# Patient Record
Sex: Female | Born: 1935 | Race: White | Hispanic: No | Marital: Married | Smoking: Never smoker
Health system: Southern US, Community
[De-identification: ages and names within clinical notes are randomized; demographics above are authoritative.]

## PROBLEM LIST (undated history)

## (undated) DIAGNOSIS — T7840XA Allergy, unspecified, initial encounter: Secondary | ICD-10-CM

## (undated) DIAGNOSIS — I10 Essential (primary) hypertension: Secondary | ICD-10-CM

## (undated) DIAGNOSIS — M1990 Unspecified osteoarthritis, unspecified site: Secondary | ICD-10-CM

## (undated) HISTORY — DX: Essential (primary) hypertension: I10

## (undated) HISTORY — DX: Allergy, unspecified, initial encounter: T78.40XA

## (undated) HISTORY — DX: Unspecified osteoarthritis, unspecified site: M19.90

---

## 1990-09-09 ENCOUNTER — Encounter (INDEPENDENT_AMBULATORY_CARE_PROVIDER_SITE_OTHER): Payer: Self-pay | Admitting: *Deleted

## 1999-06-26 ENCOUNTER — Encounter: Admission: RE | Admit: 1999-06-26 | Discharge: 1999-06-26 | Payer: Self-pay | Admitting: Orthopedic Surgery

## 1999-06-26 ENCOUNTER — Encounter: Payer: Self-pay | Admitting: Orthopedic Surgery

## 2000-05-27 ENCOUNTER — Other Ambulatory Visit: Admission: RE | Admit: 2000-05-27 | Discharge: 2000-05-27 | Payer: Self-pay | Admitting: Gastroenterology

## 2000-05-27 ENCOUNTER — Encounter (INDEPENDENT_AMBULATORY_CARE_PROVIDER_SITE_OTHER): Payer: Self-pay | Admitting: Specialist

## 2000-05-27 ENCOUNTER — Encounter: Payer: Self-pay | Admitting: Gastroenterology

## 2003-12-05 ENCOUNTER — Encounter: Admission: RE | Admit: 2003-12-05 | Discharge: 2003-12-22 | Payer: Self-pay | Admitting: Internal Medicine

## 2004-02-06 ENCOUNTER — Ambulatory Visit: Payer: Self-pay

## 2004-07-04 ENCOUNTER — Encounter: Admission: RE | Admit: 2004-07-04 | Discharge: 2004-07-17 | Payer: Self-pay | Admitting: Internal Medicine

## 2005-08-26 ENCOUNTER — Encounter: Admission: RE | Admit: 2005-08-26 | Discharge: 2005-09-11 | Payer: Self-pay | Admitting: Internal Medicine

## 2006-03-04 ENCOUNTER — Ambulatory Visit: Payer: Self-pay | Admitting: Gastroenterology

## 2008-03-20 ENCOUNTER — Ambulatory Visit: Payer: Self-pay | Admitting: Gastroenterology

## 2008-04-20 ENCOUNTER — Encounter: Payer: Self-pay | Admitting: Gastroenterology

## 2008-04-20 ENCOUNTER — Ambulatory Visit: Payer: Self-pay | Admitting: Gastroenterology

## 2008-04-21 ENCOUNTER — Encounter: Payer: Self-pay | Admitting: Gastroenterology

## 2009-05-31 ENCOUNTER — Telehealth: Payer: Self-pay | Admitting: Gastroenterology

## 2009-06-06 ENCOUNTER — Ambulatory Visit (HOSPITAL_COMMUNITY): Admission: RE | Admit: 2009-06-06 | Discharge: 2009-06-06 | Payer: Self-pay | Admitting: Internal Medicine

## 2009-06-06 ENCOUNTER — Encounter: Payer: Self-pay | Admitting: Physician Assistant

## 2009-07-31 ENCOUNTER — Telehealth: Payer: Self-pay | Admitting: Gastroenterology

## 2009-08-07 ENCOUNTER — Ambulatory Visit: Payer: Self-pay | Admitting: Internal Medicine

## 2009-08-07 DIAGNOSIS — I10 Essential (primary) hypertension: Secondary | ICD-10-CM | POA: Insufficient documentation

## 2009-08-07 DIAGNOSIS — R143 Flatulence: Secondary | ICD-10-CM

## 2009-08-07 DIAGNOSIS — E785 Hyperlipidemia, unspecified: Secondary | ICD-10-CM

## 2009-08-07 DIAGNOSIS — R142 Eructation: Secondary | ICD-10-CM

## 2009-08-07 DIAGNOSIS — R1032 Left lower quadrant pain: Secondary | ICD-10-CM | POA: Insufficient documentation

## 2009-08-07 DIAGNOSIS — Z860100 Personal history of colon polyps, unspecified: Secondary | ICD-10-CM | POA: Insufficient documentation

## 2009-08-07 DIAGNOSIS — R141 Gas pain: Secondary | ICD-10-CM

## 2009-08-07 DIAGNOSIS — R1031 Right lower quadrant pain: Secondary | ICD-10-CM

## 2009-08-07 DIAGNOSIS — R12 Heartburn: Secondary | ICD-10-CM

## 2009-08-10 ENCOUNTER — Ambulatory Visit: Payer: Self-pay | Admitting: Cardiovascular Disease

## 2009-08-10 LAB — CONVERTED CEMR LAB
BUN: 17 mg/dL (ref 6–23)
Basophils Absolute: 0 10*3/uL (ref 0.0–0.1)
Basophils Relative: 0.6 % (ref 0.0–3.0)
CO2: 35 meq/L — ABNORMAL HIGH (ref 19–32)
CRP, High Sensitivity: 1.45 (ref 0.00–5.00)
Calcium: 9.5 mg/dL (ref 8.4–10.5)
Chloride: 100 meq/L (ref 96–112)
Creatinine, Ser: 0.8 mg/dL (ref 0.4–1.2)
Eosinophils Absolute: 0.2 10*3/uL (ref 0.0–0.7)
Eosinophils Relative: 2.7 % (ref 0.0–5.0)
GFR calc non Af Amer: 78.78 mL/min (ref >60)
Glucose, Bld: 90 mg/dL (ref 70–99)
HCT: 37.1 % (ref 36.0–46.0)
Hemoglobin: 12.4 g/dL (ref 12.0–15.0)
Lymphocytes Relative: 24 % (ref 12.0–46.0)
Lymphs Abs: 1.4 10*3/uL (ref 0.7–4.0)
MCHC: 33.5 g/dL (ref 30.0–36.0)
MCV: 94.4 fL (ref 78.0–100.0)
Monocytes Absolute: 0.4 10*3/uL (ref 0.1–1.0)
Monocytes Relative: 7 % (ref 3.0–12.0)
Neutro Abs: 3.8 10*3/uL (ref 1.4–7.7)
Neutrophils Relative %: 65.7 % (ref 43.0–77.0)
Platelets: 230 10*3/uL (ref 150.0–400.0)
Potassium: 4.1 meq/L (ref 3.5–5.1)
RBC: 3.93 M/uL (ref 3.87–5.11)
RDW: 13.1 % (ref 11.5–14.6)
Sodium: 142 meq/L (ref 135–145)
WBC: 5.8 10*3/uL (ref 4.5–10.5)

## 2009-08-13 ENCOUNTER — Ambulatory Visit: Payer: Self-pay | Admitting: Gastroenterology

## 2009-08-13 DIAGNOSIS — R933 Abnormal findings on diagnostic imaging of other parts of digestive tract: Secondary | ICD-10-CM

## 2010-04-09 NOTE — Assessment & Plan Note (Signed)
Summary: F/U Abd pain, review CT results, saw PA   History of Present Illness Visit Type: Follow-up Visit Primary GI MD: Elie Goody MD [facility] Primary Provider: Geoffry Paradise, MD Chief Complaint: follow-up abdominal pain, review CT results. abdominal pain is better History of Present Illness:   This is a return office visit for lower abdominal pain and intestinal gas. Her symptoms improved substantially with Gas-X and Align although they persist. She notes bloating, lower abdominal pain, and improvement in symptoms with passage of flatus.  CT Scan of the abdomen and pelvis revealed questionable gallbladder calcifications. However, a recent abdominal ultrasound did not note this abnormality. An 8 mm hepatic lesion was noted onh CT, which had features most typical of a hemangioma.   GI Review of Systems    Reports bloating.      Denies abdominal pain, acid reflux, belching, chest pain, dysphagia with liquids, dysphagia with solids, heartburn, loss of appetite, nausea, vomiting, vomiting blood, weight loss, and  weight gain.        Denies anal fissure, black tarry stools, change in bowel habit, constipation, diarrhea, diverticulosis, fecal incontinence, heme positive stool, hemorrhoids, irritable bowel syndrome, jaundice, light color stool, liver problems, rectal bleeding, and  rectal pain.   Current Medications (verified): 1)  Benicar Hct 40-12.5 Mg Tabs (Olmesartan Medoxomil-Hctz) .... Once Daily 2)  Nexium 40 Mg Cpdr (Esomeprazole Magnesium) .... Once Daily As Needed 3)  Align  Caps (Probiotic Product) .... Take 1 Tab Daily For 1 Month 4)  Celebrex 200 Mg Caps (Celecoxib) .Marland Kitchen.. 1 By Mouth Once Daily 5)  Gas-X Extra Strength 125 Mg Caps (Simethicone) .Marland Kitchen.. 1 By Mouth Two Times A Day  Allergies (verified): 1)  ! Asa 2)  ! Penicillin 3)  ! Voltaren 4)  ! Prednisone 5)  ! Ultram 6)  ! * Tiazac  Past History:  Past Medical History: Reviewed history from 08/09/2009 and no changes  required. Arthritis Hyperlipidemia Hypertension COLON POLYPS/HYPERPLASTIC 2/10 Osteoporosis Hemorrhoids GERD  Past Surgical History: D&C  Family History: Reviewed history from 08/07/2009 and no changes required. No FH of Colon Cancer: Family History of Heart Disease: Both parents  Social History: Reviewed history from 08/07/2009 and no changes required. Occupation: Retired Patient has never smoked.  Alcohol Use - no Illicit Drug Use - no Married  Review of Systems       The pertinent positives and negatives are noted as above and in the HPI. All other ROS were reviewed and were negative.   Vital Signs:  Patient profile:   77 year old female Height:      64 inches Weight:      137 pounds BMI:     23.60 Pulse rate:   68 / minute Pulse rhythm:   irregular BP sitting:   102 / 64  (left arm)  Vitals Entered By: Milford Cage NCMA (August 13, 2009 10:06 AM)  Physical Exam  General:  Well developed, well nourished, no acute distress. Head:  Normocephalic and atraumatic. Eyes:  PERRLA, no icterus. Mouth:  No deformity or lesions, dentition normal. Lungs:  Clear throughout to auscultation. Heart:  Regular rate and rhythm; no murmurs, rubs,  or bruits. Abdomen:  Soft, nontender and nondistended. No masses, hepatosplenomegaly or hernias noted. Normal bowel sounds. Psych:  Alert and cooperative. Normal mood and affect.  Impression & Recommendations:  Problem # 1:  ABDOMINAL BLOATING (ICD-787.3) Gas, bloating, and flatus. This appears to be the cause of her lower abdominal pain. Trial of Xifaxan,  for 10 days and Levsin sublingually as needed. She will remain on a low gas diet and increase the Gas-X to q.i.d. as needed.  Problem # 2:  ABNORMAL FINDINGS GI TRACT (ICD-793.4) Questionable gallbladder wall calcifications on CT scan and an 8 mm hepatic lesion consistent with a hemangioma. These abnormalities were not noted on abdominal ultrasound. No plan for further workup at  this time.  Patient Instructions: 1)  Pick up your prescriptions from your pharmacy.  2)  Increase Gas-X four times a day as needed.  3)  Excessive Gas Diet handout given.  4)  Please schedule a follow-up appointment as needed.  5)  Copy sent to : Geoffry Paradise, MD 6)  The medication list was reviewed and reconciled.  All changed / newly prescribed medications were explained.  A complete medication list was provided to the patient / caregiver.  Prescriptions: LEVSIN/SL 0.125 MG SUBL (HYOSCYAMINE SULFATE) 1-2 tablet by mouth under tongue four times a day as needed  #60 x 11   Entered by:   Christie Nottingham CMA (AAMA)   Authorized by:   Meryl Dare MD [facility]   Signed by:   Meryl Dare MD [facility] on 08/13/2009   Method used:   Electronically to        [facility]* (retail)       [address] [place], [place]  [address]       Ph: [phone]       Fax: [phone]   RxID:   [id number] XIFAXAN 550 MG TABS (RIFAXIMIN) one tablet by mouth three times a day  #30 x 0   Entered by:   Christie Nottingham CMA (AAMA)   Authorized by:   Meryl Dare MD [facility]   Signed by:   Meryl Dare MD [facility] on 08/13/2009   Method used:   Electronically to        [facility]* (retail)       [address] [place], [place]  [address]       Ph: [phone]       Fax: [phone]   RxID:   [id number]

## 2010-04-09 NOTE — Procedures (Signed)
Summary: EGD/[place]  EGD/[place]   Imported By: Sherian Rein 08/14/2009 09:22:34  _____________________________________________________________________  External Attachment:    Type:   Image     Comment:   External Document

## 2010-04-09 NOTE — Assessment & Plan Note (Signed)
Summary: abdominal pain/diarrhea/sheri   History of Present Illness Visit Type: Initial Visit Primary GI MD: Elie Goody MD [facility] Primary Provider: Gus Rankin, MD Chief Complaint: Abdominal pain(R-sided) History of Present Illness:   VEY NICE 77 YO FEMALE KNOWN TO DR. Russella Dar. SHE UNDERWENT COLONSCOCPY 2/10  FOR HEME + STOOL, AND WAS FOUND TO HAVE A CECAL POLYP AND INTERNAL HEMORRHOIDS. SHE COMES IN TODAY WITH C/O 3 MONTH HX OF LOWER ABDOMINAL PAIN. SHE DESCRIBES FREQUENT LOWER ABDOMINAL PAIN THAT FEELS LIKE PAINFUL TRAPPED GAS-THIS RADIATES ACROSS HER LOWER ABDOMEN AND INTO HER RIGHT SIDE. SHE SEEMS TO HAVE SOME HEARTBURN ASSOCIATED WITH IT AS WELL. HER APPETITE IS OK,BUT ALMOST EERYTHING HURTS HER AFTER SHE EATS.WEIGHT STABLE. BM'S LOOSER MOST DAYS WHEN HAVING PAIN. SHE FEELS BLOATED. NO URINARY SXS, NO MELENA OR HEME.  SHE HAD ABD [place] PER DR. Jacky Kindle WHICH WAS NEGATIVEHAS BEEN ON NEXIUM SHORT TERM AND FEELS IT HELPS THE HEARTBURN SOME,BUT DOESN'T WANT TO STAY ON IT DUE TO OSTEOPOROSIS.   GI Review of Systems    Reports abdominal pain, bloating, and  nausea.     Location of  Abdominal pain: lower abdomen.    Denies acid reflux, belching, chest pain, dysphagia with liquids, dysphagia with solids, heartburn, loss of appetite, vomiting, vomiting blood, weight loss, and  weight gain.      Reports diarrhea and  rectal pain.     Denies anal fissure, black tarry stools, change in bowel habit, constipation, diverticulosis, fecal incontinence, heme positive stool, hemorrhoids, irritable bowel syndrome, jaundice, light color stool, liver problems, and  rectal bleeding. Preventive Screening-Counseling & Management  Alcohol-Tobacco     Smoking Status: never      Drug Use:  no.      Current Medications (verified): 1)  Benicar Hct 40-12.5 Mg Tabs (Olmesartan Medoxomil-Hctz) .... Once Daily 2)  Nexium 40 Mg Cpdr (Esomeprazole Magnesium) .... Once Daily  Allergies (verified): 1)  !  Asa 2)  ! Penicillin 3)  ! Voltaren 4)  ! Prednisone 5)  ! Ultram 6)  ! * Tiazac  Past History:  Past Medical History: Arthritis Hyperlipidemia Hypertension COLON POLYPS/HYPERPLASTIC 2/10  Past Surgical History: Unremarkable  Family History: No FH of Colon Cancer: Family History of Heart Disease: Both parents  Social History: Occupation: Retired Patient has never smoked.  Alcohol Use - no Illicit Drug Use - no Smoking Status:  never Drug Use:  no  Review of Systems       The patient complains of allergy/sinus, arthritis/joint pain, back pain, headaches-new, heart murmur, and muscle pains/cramps.  The patient denies anemia, anxiety-new, blood in urine, breast changes/lumps, change in vision, confusion, cough, coughing up blood, depression-new, fainting, fatigue, fever, hearing problems, heart rhythm changes, itching, menstrual pain, night sweats, nosebleeds, pregnancy symptoms, shortness of breath, skin rash, sleeping problems, sore throat, swelling of feet/legs, swollen lymph glands, thirst - excessive , urination - excessive , urination changes/pain, urine leakage, vision changes, and voice change.         ROS OTHERWISE AS IN HPI  Vital Signs:  Patient profile:   77 year old female Height:      64 inches Weight:      136.25 pounds BMI:     23.47 Pulse rate:   80 / minute Pulse rhythm:   regular BP sitting:   92 / 60  (left arm) Cuff size:   regular  Vitals Entered By: June McMurray CMA Duncan Dull) (Aug 07, 2009 9:58 AM)  Physical Exam  General:  Well developed, well nourished, no acute distress. Head:  Normocephalic and atraumatic. Eyes:  PERRLA, no icterus. Chest Wall:  kyphosis.   Lungs:  Clear throughout to auscultation. Heart:  Regular rate and rhythm; no murmurs, rubs,  or bruits. Abdomen:  SOFT, NO FOCAL; TENDERNESS LOWER ABDOMEN, NO PALP MASS OR HSM,BS+ Rectal:  NOT DONE Extremities:  No clubbing, cyanosis, edema or deformities noted. Neurologic:  Alert  and  oriented x4;  grossly normal neurologically. Psych:  Alert and cooperative. Normal mood and affect.   Impression & Recommendations:  Problem # 1:  RLQ PAIN (ICD-789.03) Assessment New  77 YO FEMALE WITH 3 MONTH HX OF PERSISTENT LOWER ABDOMINAL PAIN,VARYING INTENSITY,ASSOCIATED WITH BLOATING. NO HX OF DIVERTICULAR DISEASE, NO PRIOR SURGERIES. ETIOLOGY IS NOT CLEAR. R/O PELVIC PATHOLOGY/MALIGNANCY,R/O MESENTERIC INSUFFICIENCY, R/O  FUNCTIONAL CONSTIPATION ETC.  TRIAL OF ALIGN ONE DAILY X 30 DAYS LABS AS BELOW SCHEDULE FOR CT ABD/PELVIS WITH CONTRAST ,ASK TO ASSESS MESENTERIC VESSELS AS WELL CONTINUE GAS X AS NEEDED Orders: TLB-CBC Platelet - w/Differential (85025-CBCD) TLB-BMP (Basic Metabolic Panel-BMET) (80048-METABOL) TLB-CRP-High Sensitivity (C-Reactive Protein) (86140-FCRP) CT Abdomen/Pelvis with Contrast (CT Abd/Pelvis w/con)  Problem # 2:  HEARTBURN (ICD-787.1) Assessment: New PT MAY CONTINUE NEXIUM 40 MG DAILY OR TRY OTC  ZANTAC  TWICE DAILY.  Problem # 3:  PERSONAL HX COLONIC POLYPS (ICD-V12.72) Assessment: Comment Only HYPERPLASTIC 2/10 -FOLLOW UP 04/2018  Problem # 4:  PERSONAL HX COLONIC POLYPS (ICD-V12.72) Assessment: Comment Only  Patient Instructions: 1)  Take the Align capsules 1 daily for 1 month.  2)  We have schedueld the CT scan for FRIDAY 08-11-09. 3)  Directions and contrast given. 4)  Continue the Nexium or Aantac daily. 5)  We changed your appt with Dr. Russella Dar for [facility] 08-13-09. He will review the CT results with you. 6)  Copy sent to : Geoffry Paradise, MD 7)  The medication list was reviewed and reconciled.  All changed / newly prescribed medications were explained.  A complete medication list was provided to the patient / caregiver.

## 2010-04-09 NOTE — Procedures (Signed)
Summary: Colonoscopy and biopsy   Colonoscopy  Procedure date:  05/27/2000  Findings:      Results: Hemorrhoids.     Pathology:  Hyperplastic polyp.     Location:  [facility].    Procedures Next Due Date:    Colonoscopy: 06/2005 Patient Name: Jodi Barr, Jodi Barr MRN:  Procedure Procedures: Colonoscopy CPT: 30865.    with biopsy. CPT: Q5068410.    with Hot Biopsy(s)CPT: Z451292.  Personnel: Endoscopist: Venita Lick. Russella Dar, MD, Clementeen Graham.  Exam Location: Exam performed in Outpatient Clinic. Outpatient  Patient Consent: Procedure, Alternatives, Risks and Benefits discussed, consent obtained, from patient.  Indications Symptoms: Diarrhea Change in bowel habits.  Average Risk Screening Routine.  History  Pre-Exam Physical: Performed May 27, 2000. Cardio-pulmonary exam, Rectal exam, HEENT exam , Abdominal exam, Extremity exam, Neurological exam, Mental status exam WNL.  Exam Exam: Extent of exam reached: Cecum, extent intended: Cecum.  The cecum was identified by appendiceal orifice and IC valve. Colon retroflexion performed. Images taken. ASA Classification: II. Tolerance: good.  Monitoring: Pulse and BP monitoring, Oximetry used. Supplemental O2 given.  Colon Prep Used Golytely for colon prep. Dose Used: 4L. Prep results: good.  Sedation Meds: Demerol 70 mg. Versed 5 mg.  Findings POLYP: Descending Colon, Maximum size: 7 mm. sessile polyp. Procedure:  hot biopsy, removed, retrieved, Polyp sent to pathology. ICD9: Colon Polyps: 211.3.  NORMAL EXAM: Cecum to Splenic Flexure. Comments: biopsy obtained.  NORMAL EXAM: Sigmoid Colon to Rectum. Comments: biopsy obtained.  HEMORRHOIDS: Internal. Size: Small. Not bleeding. Not thrombosed. ICD9: Hemorrhoids, Internal: 455.0.   Assessment Abnormal examination, see findings above.  Diagnoses: 211.3: Colon Polyps.  455.0: Hemorrhoids, Internal.   Events  Unplanned Interventions: No intervention was required.    Unplanned Events: There were no complications. Plans  Post Exam Instructions: No aspirin or non-steroidal containing medications: 2 weeks.  Medication Plan: Await pathology. Continue current medications.  Patient Education: Patient given standard instructions for: Polyps.  Disposition: After procedure patient sent to recovery. After recovery patient sent home.  Scheduling/Referral: Colonoscopy, to [facility] T. Russella Dar, MD, [facility], around May 27, 2005.  Primary Care Provider, patient to establish,    This report was created from the original endoscopy report, which was reviewed and signed by the above listed endoscopist.         SP Surgical Pathology - STATUS: Final             By: Morrie Sheldon,       Perform Date: 20Mar02 17:06  Ordered By: Rica Records Date:  Facility: [facility]                              Department: CPATH  Service Report Text  [facility]   [address] [place], [place] [address]   [phone]    REPORT OF SURGICAL PATHOLOGY    Case #: [id number]   Patient Name: Jodi Barr, Jodi Barr   PID: [id number]   Pathologist: Beulah Gandy. Luisa Hart, MD   DOB/Age 03/23/1932 (Age: 77) Gender: F   Date Taken: 05/27/2000   Date Received: 05/27/2000    FINAL DIAGNOSIS    ***MICROSCOPIC EXAMINATION AND DIAGNOSIS***    I. COLON, ASCENDING, BIOPSIES: UNREMARKABLE COLONIC MUCOSA. NO   SIGNIFICANT INFLAMMATION OR OTHER ABNORMALITIES IDENTIFIED.    II. COLON, SIGMIOD, BIOPSIES: UNREMARKABLE COLONIC MUCOSA. NO  SIGNIFICANT INFLAMMATION OR OTHER ABNORMALITIES IDENTIFIED.    III. COLON, POLYP: HYPERPLASTIC POLYP. NO ADENOMATOUS CHANGE   OR MALIGNANCY IDENTIFIED.    COMMENT   I & II. There is colonic mucosa with normal crypt architecture   and no objective increase in inflammation. No active   inflammation, microscopic colitis, collagenous colitis or   significant chronic changes identified. No hyperplastic or    adenomatous changes are seen, and there is no evidence of   malignancy.    smr   Date Reported: 05/28/2000 Beulah Gandy. Luisa Hart, MD   *** Electronically Signed Out By JDP ***    Clinical information   Diarrhea. R/O micro colitis (atb)    specimen(s) obtained   1: Ascending colon bx   2: Sigmoid colon bx   3: Hot bx colon polyp    Gross Description   I. Received in formalin are tan, soft tissue fragments that are   submitted in toto. Number: 2   Size: 0.1 cm each    II. Received in formalin are tan, soft tissue fragments that are   submitted in toto. Number: 2   Size: 0.2 cm each    III. Received in formalin is a tan, soft tissue fragment that is   submitted en toto. Size: 0.2 cm (SSW:atb 3/20)    ab/

## 2010-04-09 NOTE — Progress Notes (Signed)
Summary: triage  Phone Note Call from Patient Call back at Home Phone [phone]   Caller: Patient Call For: Russella Dar Reason for Call: Talk to Nurse Summary of Call: Patient would like to come in sooner than next available due to lower abd pain. (scheduled pt for next available appt 7-13) Initial call taken by: Tawni Levy,  Jul 31, 2009 12:53 PM  Follow-up for Phone Call        Patient having continued abdominal pain and diarrhea, she has canceled multiple appts because she had started to feel better.  Patient will come in and see Mike Gip PA next week 08/07/09 10:00 Follow-up by: Darcey Nora RN, CGRN,  Jul 31, 2009 1:53 PM

## 2010-04-09 NOTE — Progress Notes (Signed)
Summary: triage  Phone Note Call from Patient Call back at Home Phone [phone]   Caller: Patient Call For: Dr. Russella Dar Reason for Call: Talk to Nurse Summary of Call: would like to come in sooner than next available... pain from lower abd up into esophagus per pt as well as nausea Initial call taken by: Vallarie Mare,  May 31, 2009 12:41 PM  Follow-up for Phone Call        patient with lower abdominal pain and nausea.  Worsening over the last few days.  Patient  requesting an earlier appointment with Dr Russella Dar. Patient will come in and see Willette Cluster RNP on Monday 06-04-09 9:30 Follow-up by: Darcey Nora RN, CGRN,  May 31, 2009 1:54 PM

## 2010-07-26 NOTE — Assessment & Plan Note (Signed)
[facility]                         GASTROENTEROLOGY OFFICE NOTE   Jodi Barr, Jodi Barr                     MRN:          [id number]  DATE:03/04/2006                            DOB:          [birth year]    CHIEF COMPLAINT:  A 77 year old white female with constipation and  rectal pain.   HISTORY OF PRESENT ILLNESS:  I previously evaluated Jodi Barr.  She  underwent colonoscopy in March of 2002 with removal of hyperplastic  polyps.  She was having diarrhea at the time and random colon biopsies  were unremarkable.  Internal hemorrhoids were also noted on that  examination.  She has recently been having problems with hard stools,  constipation and straining.  She notes lower abdominal cramping and gas.  She also has had perianal itching and soreness for about the past 3 to 4  weeks. These symptoms have improved with the use of Tucks.  She has been  taking calcium recently and feels that this has exacerbated her  constipation.  Blood work from Dr. Lanell Matar office from January 05, 2006 reveals a normal CBC, urinalysis and CMET.  She notes no change in  stool caliber, melena, hematochezia, fevers or chills.  She has been  intentionally trying to lose weight and states that she has lost several  pounds.   PAST MEDICAL HISTORY:  Osteoporosis, arthritis, hemorrhoids, history of  GERD.   CURRENT MEDICATIONS:  Listed on the chart-update and reviewed.   MEDICATION ALLERGIES:  To PREDNISONE leading to a rash.   SOCIAL HISTORY, REVIEW OF SYSTEMS:  Present in the hand written  evaluation form.   PHYSICAL EXAMINATION:  No acute distress.  Height 5 feet 3 inches, weight 139 pounds.  Blood pressure is 122/68.  Pulse 80 and regular.  HEENT:  Anicteric sclerae, oropharynx clear.  CHEST:  Clear to auscultation bilaterally.  CARDIAC:  Regular rate and rhythm without murmurs appreciated.  ABDOMEN:  Is soft, nontender, nondistended.  Normoactive bowel sounds.  No palpable organomegaly, masses or hernias.  DIGITAL RECTAL:  Reveals a small thrombosed external hemorrhoid that is  mild to moderately tender. No internal lesions.  Hemoccult negative,  brown hard stool in the vault.   ASSESSMENT AND PLAN:  Constipation with a thrombosed external  hemorrhoid.  She is to substantially increase her fluid and fiber  intake.  Begin Benefiber supplements once or twice a day to allow for  adequate bowel movements.  Standard rectal care instructions and  hemorrhoid care instructions supplied to the patient.  Began Analpram  2.5% HC cream externally and  internally as needed.  If her symptoms do not completely resolve within  the next 2 to 4 weeks she will return for followup, otherwise ongoing  follow up with Jodi Barr.     Jodi Lick. Russella Dar, MD, [facility]  Electronically Signed    MTS/MedQ  DD: 03/06/2006  DT: 03/06/2006  Job #: [id number]   cc:   Geoffry Paradise, M.D.

## 2011-01-03 ENCOUNTER — Other Ambulatory Visit (HOSPITAL_COMMUNITY): Payer: Self-pay | Admitting: Orthopedic Surgery

## 2011-01-03 DIAGNOSIS — R102 Pelvic and perineal pain: Secondary | ICD-10-CM

## 2011-01-09 ENCOUNTER — Encounter (HOSPITAL_COMMUNITY)
Admission: RE | Admit: 2011-01-09 | Discharge: 2011-01-09 | Disposition: A | Discharge status: Home / Self Care | Payer: Medicare Other | Source: Ambulatory Visit | Attending: Orthopedic Surgery | Admitting: Orthopedic Surgery

## 2011-01-09 DIAGNOSIS — R102 Pelvic and perineal pain: Secondary | ICD-10-CM

## 2011-01-09 DIAGNOSIS — M549 Dorsalgia, unspecified: Secondary | ICD-10-CM | POA: Insufficient documentation

## 2011-01-09 DIAGNOSIS — M25559 Pain in unspecified hip: Secondary | ICD-10-CM | POA: Insufficient documentation

## 2011-01-09 DIAGNOSIS — N949 Unspecified condition associated with female genital organs and menstrual cycle: Secondary | ICD-10-CM | POA: Insufficient documentation

## 2011-01-09 MED ORDER — TECHNETIUM TC 99M MEDRONATE IV KIT: 24.6000 | PACK | Freq: Once | INTRAVENOUS | Status: DC | PRN

## 2012-06-08 ENCOUNTER — Other Ambulatory Visit: Payer: Self-pay

## 2013-03-31 ENCOUNTER — Telehealth: Payer: Self-pay | Admitting: *Deleted

## 2013-03-31 ENCOUNTER — Ambulatory Visit (INDEPENDENT_AMBULATORY_CARE_PROVIDER_SITE_OTHER): Payer: Medicare Other | Admitting: Podiatry

## 2013-03-31 ENCOUNTER — Encounter: Payer: Self-pay | Admitting: Podiatry

## 2013-03-31 VITALS — BP 136/76 | HR 74 | Resp 12

## 2013-03-31 DIAGNOSIS — L600 Ingrowing nail: Secondary | ICD-10-CM

## 2013-03-31 NOTE — Patient Instructions (Signed)

## 2013-03-31 NOTE — Progress Notes (Signed)
    N-SORE L-LT FOOT GREAT TOENAIL D-3 WEEKS O-SLOWLY C-SAME A-PRESSURE T-N/A

## 2013-03-31 NOTE — Telephone Encounter (Signed)
Patient called asking about how to soak and do showers. I informed pt to shower without bandage starting tomorrow and then to soak toe after showering. Pt understood.

## 2013-03-31 NOTE — Progress Notes (Signed)
Subjective:     Patient ID: Jodi Barr, female   DOB: [birth year], 80 y.o.   MRN: [id number]  HPI patient presents stating my right big toenail medial border has been sore and I cannot cut it myself and the sheets at night really hurt   Review of Systems     Objective:   Physical Exam Neurovascular status intact with no other health history changes noted and incurvated right hallux medial border that sore when pressed with nail deformity noted.    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 Explained procedure and risks patient wants to procedure and today I infiltrated 60 mg Xylocaine Marcaine mixture remove the medial border exposed matrix and apply chemical phenol 3 applications followed by alcohol lavaged and sterile dressing. Instructed on soaks and reappoint her recheck

## 2013-05-02 ENCOUNTER — Telehealth: Payer: Self-pay | Admitting: *Deleted

## 2013-05-02 NOTE — Telephone Encounter (Signed)
Patient referred to schedulers to see if they can work her into the schedule.

## 2013-05-02 NOTE — Telephone Encounter (Signed)
Patient calls stating that she had ingrown procedure done few weeks ago and now the base of the toenail is black and extremely sore. She is requesting an appt ASAP.

## 2013-05-02 NOTE — Telephone Encounter (Signed)
SCHEDULED PT FOR 05/04/13

## 2013-05-04 ENCOUNTER — Encounter: Payer: Self-pay | Admitting: Podiatry

## 2013-05-04 ENCOUNTER — Ambulatory Visit (INDEPENDENT_AMBULATORY_CARE_PROVIDER_SITE_OTHER): Payer: Medicare Other | Admitting: Podiatry

## 2013-05-04 VITALS — BP 136/72 | HR 76 | Resp 12

## 2013-05-04 DIAGNOSIS — L03039 Cellulitis of unspecified toe: Secondary | ICD-10-CM

## 2013-05-06 NOTE — Progress Notes (Signed)
Subjective:     Patient ID: Jodi Barr, female   DOB: [birth year], 80 y.o.   MRN: [id number]  HPI patient was concerned about the right hallux medial border draining and red and wanted me to check   Review of Systems     Objective:   Physical Exam Neurovascular status intact with no health history changes noted and well-healing surgical site right hallux medial border with slight proximal redness noted that is localized    Assessment:     Mild localized paronychia infection right    Plan:     Instructed on change to soaks and continued bandage usage during the day and reappoint if any further redness or drainage were to occur

## 2013-10-05 ENCOUNTER — Ambulatory Visit: Payer: Medicare Other | Attending: Internal Medicine | Admitting: Physical Therapy

## 2013-10-05 DIAGNOSIS — IMO0001 Reserved for inherently not codable concepts without codable children: Secondary | ICD-10-CM | POA: Insufficient documentation

## 2013-10-05 DIAGNOSIS — M25559 Pain in unspecified hip: Secondary | ICD-10-CM | POA: Insufficient documentation

## 2013-10-05 DIAGNOSIS — R262 Difficulty in walking, not elsewhere classified: Secondary | ICD-10-CM | POA: Insufficient documentation

## 2013-10-05 DIAGNOSIS — M545 Low back pain, unspecified: Secondary | ICD-10-CM | POA: Diagnosis not present

## 2013-10-10 ENCOUNTER — Ambulatory Visit: Payer: Medicare Other | Attending: Internal Medicine | Admitting: Physical Therapy

## 2013-10-10 DIAGNOSIS — M545 Low back pain, unspecified: Secondary | ICD-10-CM | POA: Diagnosis not present

## 2013-10-10 DIAGNOSIS — M25559 Pain in unspecified hip: Secondary | ICD-10-CM | POA: Insufficient documentation

## 2013-10-10 DIAGNOSIS — R262 Difficulty in walking, not elsewhere classified: Secondary | ICD-10-CM | POA: Diagnosis not present

## 2013-10-10 DIAGNOSIS — IMO0001 Reserved for inherently not codable concepts without codable children: Secondary | ICD-10-CM | POA: Diagnosis present

## 2013-10-13 ENCOUNTER — Ambulatory Visit: Payer: Medicare Other | Admitting: Physical Therapy

## 2013-10-13 DIAGNOSIS — IMO0001 Reserved for inherently not codable concepts without codable children: Secondary | ICD-10-CM | POA: Diagnosis not present

## 2013-10-17 ENCOUNTER — Ambulatory Visit: Payer: Medicare Other | Admitting: Physical Therapy

## 2013-10-17 DIAGNOSIS — IMO0001 Reserved for inherently not codable concepts without codable children: Secondary | ICD-10-CM | POA: Diagnosis not present

## 2013-10-19 ENCOUNTER — Ambulatory Visit: Payer: Medicare Other | Admitting: Physical Therapy

## 2013-10-19 DIAGNOSIS — IMO0001 Reserved for inherently not codable concepts without codable children: Secondary | ICD-10-CM | POA: Diagnosis not present

## 2014-10-20 ENCOUNTER — Ambulatory Visit (INDEPENDENT_AMBULATORY_CARE_PROVIDER_SITE_OTHER): Payer: Medicare Other | Admitting: Family Medicine

## 2014-10-20 VITALS — BP 124/74 | HR 82 | Temp 98.7°F | Resp 18 | Ht 61.0 in | Wt 121.8 lb

## 2014-10-20 DIAGNOSIS — I10 Essential (primary) hypertension: Secondary | ICD-10-CM | POA: Diagnosis not present

## 2014-10-20 DIAGNOSIS — M5030 Other cervical disc degeneration, unspecified cervical region: Secondary | ICD-10-CM

## 2014-10-20 DIAGNOSIS — R51 Headache: Secondary | ICD-10-CM

## 2014-10-20 DIAGNOSIS — S161XXA Strain of muscle, fascia and tendon at neck level, initial encounter: Secondary | ICD-10-CM

## 2014-10-20 DIAGNOSIS — R519 Headache, unspecified: Secondary | ICD-10-CM

## 2014-10-20 LAB — POCT URINALYSIS DIPSTICK
BILIRUBIN UA: NEGATIVE
Glucose, UA: NEGATIVE
Ketones, UA: NEGATIVE
Nitrite, UA: NEGATIVE
Protein, UA: NEGATIVE
Spec Grav, UA: 1.015
UROBILINOGEN UA: 0.2
pH, UA: 5

## 2014-10-20 LAB — POCT CBC
Granulocyte percent: 75.7 %G (ref 37–80)
HCT, POC: 42.3 % (ref 37.7–47.9)
HEMOGLOBIN: 13 g/dL (ref 12.2–16.2)
LYMPH, POC: 1.6 (ref 0.6–3.4)
MCH: 28.4 pg (ref 27–31.2)
MCHC: 30.7 g/dL — AB (ref 31.8–35.4)
MCV: 92.5 fL (ref 80–97)
MID (CBC): 0.5 (ref 0–0.9)
MPV: 7.3 fL (ref 0–99.8)
POC Granulocyte: 6.5 (ref 2–6.9)
POC LYMPH PERCENT: 18.8 %L (ref 10–50)
POC MID %: 5.5 % (ref 0–12)
Platelet Count, POC: 297 10*3/uL (ref 142–424)
RBC: 4.57 M/uL (ref 4.04–5.48)
RDW, POC: 13.3 %
WBC: 8.6 10*3/uL (ref 4.6–10.2)

## 2014-10-20 LAB — GLUCOSE, POCT (MANUAL RESULT ENTRY): POC GLUCOSE: 94 mg/dL (ref 70–99)

## 2014-10-20 NOTE — Progress Notes (Addendum)
Subjective:  This chart was scribed for Jodi Simmer, MD by Andrew Au, ED Scribe. This patient was seen in room 9 and the patient's care was started at 6:08 PM.  Patient ID: Jodi Barr, female    DOB: 06-10-1932, 81 y.o.   MRN: [id number]  HPI Chief Complaint  Patient presents with  . Hypertension  . Headache    goes to neck    HPI Comments: Jodi Barr is a 81 y.o. female who presents to the [facility] complaining of a HTN.  Pt checked her BP PTA, which was 170/95 in right arm and 163/100 in left arm at 4:50pm and at  5pm her BP was 175/95  In right arm and 175/99 in left arm. Pt checks her BP every day which usually runs 93-130/70-85 when "feeling good".   Pt has had a painful, intermittent, frontal HA above her eyes and to occipital region for the past month. She had HA prior to being seen which she rates 10/10 but states HA has resolved and currently rates her pain 0/10. Assuming her HA was due to her sinus was prescribed cefdinir on 8/1 for a sinus infection with sinus pressure and congestion, which she states did not help with symptoms. Pt states when she develops HA she has associated blurred vision, nausea, and some dizziness but denies symptoms at this time. She later developed posterior neck pain described as tight and was seen at [facility] yesterday, where she had cervical spine XR.  Her BP was 140/70's yesterday at that office.  She was prescribed skelaxin 2x a day.  She currently has some ear pain at this time but denies CP, SOB, radiating pain in arms.Prior to being seen today she took 2 tylenol. She took her BP pill this morning, following her vitamin D, Align celebrex, skelaxin, and flonase.  Just started Celebrex this week; took 3 doses so far.  Started Skelaxin first of the week 1/2 bid.    Past Medical History  Diagnosis Date  . Arthritis   . Hypertension    Prior to Admission medications   Medication Sig Start Date End Date Taking?  Authorizing Provider  acetaminophen (TYLENOL) 500 MG tablet Take 500 mg by mouth every 6 (six) hours as needed.   Yes Historical Provider, MD  cefdinir (OMNICEF) 300 MG capsule Take 300 mg by mouth 2 (two) times daily.   Yes Historical Provider, MD  celecoxib (CELEBREX) 200 MG capsule Take 200 mg by mouth 2 (two) times daily.   Yes Historical Provider, MD  cholecalciferol (VITAMIN D) 1000 UNITS tablet Take 1,000 Units by mouth daily.   Yes Historical Provider, MD  fexofenadine (ALLEGRA) 180 MG tablet Take 180 mg by mouth daily.   Yes Historical Provider, MD  fluticasone (FLONASE) 50 MCG/ACT nasal spray Place into both nostrils daily.   Yes Historical Provider, MD  metaxalone (SKELAXIN) 800 MG tablet  02/15/13  Yes Historical Provider, MD  olmesartan-hydrochlorothiazide (BENICAR HCT) 40-12.5 MG per tablet Take 1 tablet by mouth daily.   Yes Historical Provider, MD   Review of Systems  Constitutional: Negative for fever, chills, diaphoresis and fatigue.  HENT: Negative for sinus pressure.   Eyes: Positive for visual disturbance.  Respiratory: Negative for cough and shortness of breath.   Cardiovascular: Negative for chest pain, palpitations and leg swelling.  Gastrointestinal: Negative for nausea, vomiting, abdominal pain, diarrhea and constipation.  Endocrine: Negative for cold intolerance, heat intolerance, polydipsia, polyphagia and polyuria.  Musculoskeletal: Positive  for neck pain and neck stiffness.  Neurological: Positive for dizziness and headaches. Negative for tremors, seizures, syncope, facial asymmetry, speech difficulty, weakness, light-headedness and numbness.   Objective:   Physical Exam  Constitutional: She is oriented to person, place, and time. She appears well-developed and well-nourished. No distress.  HENT:  Head: Normocephalic and atraumatic.  Right Ear: Hearing, tympanic membrane, external ear and ear canal normal.  Left Ear: Hearing, tympanic membrane, external ear and  ear canal normal.  Nose: Nose normal. Right sinus exhibits no maxillary sinus tenderness and no frontal sinus tenderness. Left sinus exhibits no maxillary sinus tenderness and no frontal sinus tenderness.  Mouth/Throat: Oropharynx is clear and moist.  Eyes: Conjunctivae and EOM are normal. Pupils are equal, round, and reactive to light.  Neck: Normal range of motion. Neck supple. Carotid bruit is not present. No thyromegaly present.  Cardiovascular: Normal rate, regular rhythm, normal heart sounds and intact distal pulses.  Exam reveals no gallop and no friction rub.   No murmur heard. Pulmonary/Chest: Effort normal and breath sounds normal. She has no wheezes. She has no rales.  Abdominal: Soft. Bowel sounds are normal. She exhibits no distension and no mass. There is no tenderness. There is no rebound and no guarding.  Musculoskeletal:       Right shoulder: Normal. She exhibits normal range of motion and no tenderness.       Left shoulder: Normal. She exhibits normal range of motion and no tenderness.       Cervical back: She exhibits decreased range of motion, tenderness, pain and spasm. She exhibits no bony tenderness and normal pulse.  Lymphadenopathy:    She has no cervical adenopathy.  Neurological: She is alert and oriented to person, place, and time. She has normal strength. No cranial nerve deficit or sensory deficit. She displays a negative Romberg sign. Coordination and gait normal.  Skin: Skin is warm and dry. No rash noted. She is not diaphoretic. No erythema. No pallor.  Psychiatric: Her behavior is normal. Her mood appears anxious.  Nursing note and vitals reviewed.    Filed Vitals:   10/20/14 1800 10/20/14 1851  BP: 148/78 124/74  Pulse: 82   Temp: 98.7 F (37.1 C)   TempSrc: Oral   Resp: 18   Height: 5\' 1"  (1.549 m)   Weight: 121 lb 12.8 oz (55.248 kg)   SpO2: 97%    Assessment & Plan:   1. Essential hypertension, benign   2. Acute nonintractable headache,  unspecified headache type   3. Neck strain, initial encounter   4. Degenerative disc disease, cervical    1. HTN: recent elevation with acute pain; feel that elevation in BP is secondary to acute pain from neck and headache; continue current dose of antihypertensive medications; obtain labs; now that pain has improved,blood pressure has improved.  2. Headache: New onset in past month; s/p treatment for acute sinusitis with some improvement in headache; now with neck pain which is contributing to headaches.  Recommend treating neck pain/strain aggressively and anticipate headaches to improve; normal neurological exam in office; pain free at current time. 3.  Neck strain: New; continue Celebrex daily; increase Skelaxin to 1/2 tablet tid.  Continue Tylenol for pain; heat to neck twice daily for 15-20 minutes; home exercise program reviewed and recommended to perform daily. No radicular symptoms at this time.  Pt to follow-up on cervical spine film results; films reviewed during visit and diffuse degenerative changes noted.    Orders Placed This Encounter  Procedures  . Comprehensive metabolic panel  . POCT CBC  . POCT glucose (manual entry)  . POCT urinalysis dipstick    Meds ordered this encounter  Medications  . DISCONTD: cefdinir (OMNICEF) 300 MG capsule    Sig: Take 300 mg by mouth 2 (two) times daily.    I personally performed the services described in this documentation, which was scribed in my presence. The recorded information has been reviewed and considered.  Lachae Hohler Paulita Fujita, M.D. [facility] [address] [place], [place]  [address] [phone] phone [phone] fax

## 2014-10-20 NOTE — Patient Instructions (Signed)
1.  Take Tylenol 2 tablets every 4-6 hours as needed for neck pain. 2. Continue Celebrex daily. 3. Continue Skelaxin  1/2 tablet twice daily. 4.  Perform neck exercises daily. 5.  Apply heat to neck twice daily for 15-20 minutes. 6. Continue to monitor blood pressure daily.

## 2014-10-21 LAB — COMPREHENSIVE METABOLIC PANEL
ALK PHOS: 71 U/L (ref 33–130)
ALT: 15 U/L (ref 6–29)
AST: 26 U/L (ref 10–35)
Albumin: 4.4 g/dL (ref 3.6–5.1)
BUN: 19 mg/dL (ref 7–25)
CO2: 28 mmol/L (ref 20–31)
Calcium: 10 mg/dL (ref 8.6–10.4)
Chloride: 95 mmol/L — ABNORMAL LOW (ref 98–110)
Creat: 1.18 mg/dL — ABNORMAL HIGH (ref 0.60–0.88)
GLUCOSE: 98 mg/dL (ref 65–99)
Potassium: 3.9 mmol/L (ref 3.5–5.3)
Sodium: 134 mmol/L — ABNORMAL LOW (ref 135–146)
Total Bilirubin: 0.6 mg/dL (ref 0.2–1.2)
Total Protein: 7 g/dL (ref 6.1–8.1)

## 2014-10-29 ENCOUNTER — Encounter: Payer: Self-pay | Admitting: Family Medicine

## 2014-10-31 ENCOUNTER — Ambulatory Visit (INDEPENDENT_AMBULATORY_CARE_PROVIDER_SITE_OTHER): Payer: Medicare Other | Admitting: Internal Medicine

## 2014-10-31 VITALS — BP 164/84 | HR 75 | Temp 98.2°F | Resp 18 | Ht 60.25 in | Wt 122.6 lb

## 2014-10-31 DIAGNOSIS — M542 Cervicalgia: Secondary | ICD-10-CM

## 2014-10-31 DIAGNOSIS — M47812 Spondylosis without myelopathy or radiculopathy, cervical region: Secondary | ICD-10-CM

## 2014-10-31 DIAGNOSIS — I10 Essential (primary) hypertension: Secondary | ICD-10-CM | POA: Diagnosis not present

## 2014-10-31 MED ORDER — METOPROLOL SUCCINATE ER 25 MG PO TB24: ORAL_TABLET | ORAL | Status: DC

## 2014-10-31 NOTE — Progress Notes (Signed)
Patient ID: Jodi Barr, female   DOB: 12/10/1932, 81 y.o.   MRN: [id number]   10/31/2014 at 9:38 AM  Jodi Barr / DOB: 1933-03-10 / MRN: [id number]  Problem list reviewed and updated by me where necessary.   SUBJECTIVE  Jodi Barr is a 81 y.o. well appearing female presenting for the chief complaint of htn.ha.., and neck pain. Reviewed her cspine xr from her doctor on CD. Has severe kyphosis, ddd, and osteoarthritis. No function loss. Also has diarrhea probably from taking too much tylenol. We discussed proper dosage. She is worried about systolic pressure elevation for last 1-2 months and is anxious about it all the time. She is taking celebrex daily whether she hurts or not. Reviewed proper use and interactions of all her meds. All recent labs reviewed from 8/12 visit. Anxiety is large component.     She  has a past medical history of Arthritis and Hypertension.    Medications reviewed and updated by myself where necessary, and exist elsewhere in the encounter.   Jodi Barr is allergic to aspirin; diclofenac sodium; diltiazem hcl; penicillins; prednisone; and tramadol hcl. She  reports that she has never smoked. She does not have any smokeless tobacco history on file. She reports that she does not drink alcohol or use illicit drugs. She  has no sexual activity history on file. The patient  has no past surgical history on file.  Her family history is not on file.  Review of Systems  Constitutional: Negative for fever and weight loss.  HENT: Negative for congestion.   Eyes: Negative.   Respiratory: Negative for shortness of breath.   Cardiovascular: Negative for chest pain.  Gastrointestinal: Positive for diarrhea. Negative for nausea.  Genitourinary: Negative.   Musculoskeletal: Positive for back pain and neck pain.  Skin: Negative for rash.  Neurological: Positive for headaches. Negative for dizziness, sensory change and focal weakness.  Psychiatric/Behavioral: The  patient is nervous/anxious.     OBJECTIVE  Her  height is 5' 0.25" (1.53 m) and weight is 122 lb 9.6 oz (55.611 kg). Her oral temperature is 98.2 F (36.8 C). Her blood pressure is 164/84 and her pulse is 75. Her respiration is 18 and oxygen saturation is 98%.  The patient's body mass index is 23.76 kg/(m^2).  Physical Exam  Constitutional: She is oriented to person, place, and time. She appears well-developed and well-nourished. She appears distressed.  HENT:  Head: Normocephalic and atraumatic.  Eyes: EOM are normal. Pupils are equal, round, and reactive to light.  Cardiovascular: Normal rate, regular rhythm, normal heart sounds and intact distal pulses.   Respiratory: Effort normal and breath sounds normal.  GI: Soft. She exhibits no distension and no mass. There is no tenderness.  Musculoskeletal: She exhibits tenderness.       Cervical back: She exhibits decreased range of motion, tenderness, bony tenderness, pain and spasm. She exhibits no swelling and no edema.       Thoracic back: She exhibits decreased range of motion, tenderness, bony tenderness, pain and spasm.  Severe osteoarthritis c and t spine  Lymphadenopathy:    She has no cervical adenopathy.  Neurological: She is alert and oriented to person, place, and time. She exhibits normal muscle tone. Coordination normal.  Skin: She is not diaphoretic.  Psychiatric: Her speech is normal and behavior is normal. Judgment and thought content normal. Her mood appears anxious.    No results found for this or any previous visit (from the past 24 hour(s)).  ASSESSMENT & PLAN  Sameria was seen today for hypertension, back pain and neck pain.  Diagnoses and all orders for this visit:  Essential hypertension -     metoprolol succinate (TOPROL-XL) 25 MG 24 hr tablet; Take 1/2 tab po prn elevated bp and rest  Osteoarthritis of neck  Pain, neck

## 2014-10-31 NOTE — Patient Instructions (Signed)
Cervical Sprain A cervical sprain is an injury in the neck in which the strong, fibrous tissues (ligaments) that connect your neck bones stretch or tear. Cervical sprains can range from mild to severe. Severe cervical sprains can cause the neck vertebrae to be unstable. This can lead to damage of the spinal cord and can result in serious nervous system problems. The amount of time it takes for a cervical sprain to get better depends on the cause and extent of the injury. Most cervical sprains heal in 1 to 3 weeks. CAUSES  Severe cervical sprains may be caused by:   Contact sport injuries (such as from football, rugby, wrestling, hockey, auto racing, gymnastics, diving, martial arts, or boxing).   Motor vehicle collisions.   Whiplash injuries. This is an injury from a sudden forward and backward whipping movement of the head and neck.  Falls.  Mild cervical sprains may be caused by:   Being in an awkward position, such as while cradling a telephone between your ear and shoulder.   Sitting in a chair that does not offer proper support.   Working at a poorly Landscape [employer] station.   Looking up or down for long periods of time.  SYMPTOMS   Pain, soreness, stiffness, or a burning sensation in the front, back, or sides of the neck. This discomfort may develop immediately after the injury or slowly, 24 hours or more after the injury.   Pain or tenderness directly in the middle of the back of the neck.   Shoulder or upper back pain.   Limited ability to move the neck.   Headache.   Dizziness.   Weakness, numbness, or tingling in the hands or arms.   Muscle spasms.   Difficulty swallowing or chewing.   Tenderness and swelling of the neck.  DIAGNOSIS  Most of the time your health care provider can diagnose a cervical sprain by taking your history and doing a physical exam. Your health care provider will ask about previous neck injuries and any known neck  problems, such as arthritis in the neck. X-rays may be taken to find out if there are any other problems, such as with the bones of the neck. Other tests, such as a CT scan or MRI, may also be needed.  TREATMENT  Treatment depends on the severity of the cervical sprain. Mild sprains can be treated with rest, keeping the neck in place (immobilization), and pain medicines. Severe cervical sprains are immediately immobilized. Further treatment is done to help with pain, muscle spasms, and other symptoms and may include:  Medicines, such as pain relievers, numbing medicines, or muscle relaxants.   Physical therapy. This may involve stretching exercises, strengthening exercises, and posture training. Exercises and improved posture can help stabilize the neck, strengthen muscles, and help stop symptoms from returning.  HOME CARE INSTRUCTIONS   Put ice on the injured area.   Put ice in a plastic bag.   Place a towel between your skin and the bag.   Leave the ice on for 15-20 minutes, 3-4 times a day.   If your injury was severe, you may have been given a cervical collar to wear. A cervical collar is a two-piece collar designed to keep your neck from moving while it heals.  Do not remove the collar unless instructed by your health care provider.  If you have long hair, keep it outside of the collar.  Ask your health care provider before making any adjustments to your collar. Minor  adjustments may be required over time to improve comfort and reduce pressure on your chin or on the back of your head.  Ifyou are allowed to remove the collar for cleaning or bathing, follow your health care provider's instructions on how to do so safely.  Keep your collar clean by wiping it with mild soap and water and drying it completely. If the collar you have been given includes removable pads, remove them every 1-2 days and hand wash them with soap and water. Allow them to air dry. They should be completely  dry before you wear them in the collar.  If you are allowed to remove the collar for cleaning and bathing, wash and dry the skin of your neck. Check your skin for irritation or sores. If you see any, tell your health care provider.  Do not drive while wearing the collar.   Only take over-the-counter or prescription medicines for pain, discomfort, or fever as directed by your health care provider.   Keep all follow-up appointments as directed by your health care provider.   Keep all physical therapy appointments as directed by your health care provider.   Make any needed adjustments to your workstation to promote good posture.   Avoid positions and activities that make your symptoms worse.   Warm up and stretch before being active to help prevent problems.  SEEK MEDICAL CARE IF:   Your pain is not controlled with medicine.   You are unable to decrease your pain medicine over time as planned.   Your activity level is not improving as expected.  SEEK IMMEDIATE MEDICAL CARE IF:   You develop any bleeding.  You develop stomach upset.  You have signs of an allergic reaction to your medicine.   Your symptoms get worse.   You develop new, unexplained symptoms.   You have numbness, tingling, weakness, or paralysis in any part of your body.  MAKE SURE YOU:   Understand these instructions.  Will watch your condition.  Will get help right away if you are not doing well or get worse. Document Released: 12/22/2006 Document Revised: 03/01/2013 Document Reviewed: 09/01/2012 [facility] Patient Information 2015 [place], [place]. This information is not intended to replace advice given to you by your health care provider. Make sure you discuss any questions you have with your health care provider. Osteoarthritis Osteoarthritis is a disease that causes soreness and inflammation of a joint. It occurs when the cartilage at the affected joint wears down. Cartilage acts as a  cushion, covering the ends of bones where they meet to form a joint. Osteoarthritis is the most common form of arthritis. It often occurs in older people. The joints affected most often by this condition include those in the:  Ends of the fingers.  Thumbs.  Neck.  Lower back.  Knees.  Hips. CAUSES  Over time, the cartilage that covers the ends of bones begins to wear away. This causes bone to rub on bone, producing pain and stiffness in the affected joints.  RISK FACTORS Certain factors can increase your chances of having osteoarthritis, including:  Older age.  Excessive body weight.  Overuse of joints.  Previous joint injury. SIGNS AND SYMPTOMS   Pain, swelling, and stiffness in the joint.  Over time, the joint may lose its normal shape.  Small deposits of bone (osteophytes) may grow on the edges of the joint.  Bits of bone or cartilage can break off and float inside the joint space. This may cause more pain and  damage. DIAGNOSIS  Your health care provider will do a physical exam and ask about your symptoms. Various tests may be ordered, such as:  X-rays of the affected joint.  An MRI scan.  Blood tests to rule out other types of arthritis.  Joint fluid tests. This involves using a needle to draw fluid from the joint and examining the fluid under a microscope. TREATMENT  Goals of treatment are to control pain and improve joint function. Treatment plans may include:  A prescribed exercise program that allows for rest and joint relief.  A weight control plan.  Pain relief techniques, such as:  Properly applied heat and cold.  Electric pulses delivered to nerve endings under the skin (transcutaneous electrical nerve stimulation [TENS]).  Massage.  Certain nutritional supplements.  Medicines to control pain, such as:  Acetaminophen.  Nonsteroidal anti-inflammatory drugs (NSAIDs), such as naproxen.  Narcotic or central-acting agents, such as  tramadol.  Corticosteroids. These can be given orally or as an injection.  Surgery to reposition the bones and relieve pain (osteotomy) or to remove loose pieces of bone and cartilage. Joint replacement may be needed in advanced states of osteoarthritis. HOME CARE INSTRUCTIONS   Take medicines only as directed by your health care provider.  Maintain a healthy weight. Follow your health care provider's instructions for weight control. This may include dietary instructions.  Exercise as directed. Your health care provider can recommend specific types of exercise. These may include:  Strengthening exercises. These are done to strengthen the muscles that support joints affected by arthritis. They can be performed with weights or with exercise bands to add resistance.  Aerobic activities. These are exercises, such as brisk walking or low-impact aerobics, that get your heart pumping.  Range-of-motion activities. These keep your joints limber.  Balance and agility exercises. These help you maintain daily living skills.  Rest your affected joints as directed by your health care provider.  Keep all follow-up visits as directed by your health care provider. SEEK MEDICAL CARE IF:   Your skin turns red.  You develop a rash in addition to your joint pain.  You have worsening joint pain.  You have a fever along with joint or muscle aches. SEEK IMMEDIATE MEDICAL CARE IF:  You have a significant loss of weight or appetite.  You have night sweats. FOR MORE INFORMATION   National Institute of Arthritis and Musculoskeletal and Skin Diseases: www.niams.http://www.myers.net/  [employer] on Aging: https://walker.com/  American College of Rheumatology: www.rheumatology.org Document Released: 02/24/2005 Document Revised: 07/11/2013 Document Reviewed: 11/01/2012 [facility] Patient Information 2015 [place], [place]. This information is not intended to replace advice given to you by your health care provider.  Make sure you discuss any questions you have with your health care provider.

## 2015-07-07 ENCOUNTER — Ambulatory Visit (INDEPENDENT_AMBULATORY_CARE_PROVIDER_SITE_OTHER): Payer: Medicare Other | Admitting: Family Medicine

## 2015-07-07 VITALS — BP 118/72 | HR 71 | Temp 99.0°F | Resp 16 | Ht 61.0 in | Wt 118.4 lb

## 2015-07-07 DIAGNOSIS — R1030 Lower abdominal pain, unspecified: Secondary | ICD-10-CM

## 2015-07-07 DIAGNOSIS — K5900 Constipation, unspecified: Secondary | ICD-10-CM | POA: Diagnosis not present

## 2015-07-07 DIAGNOSIS — R102 Pelvic and perineal pain: Secondary | ICD-10-CM

## 2015-07-07 DIAGNOSIS — N3001 Acute cystitis with hematuria: Secondary | ICD-10-CM | POA: Diagnosis not present

## 2015-07-07 DIAGNOSIS — K648 Other hemorrhoids: Secondary | ICD-10-CM

## 2015-07-07 DIAGNOSIS — R079 Chest pain, unspecified: Secondary | ICD-10-CM

## 2015-07-07 LAB — HEMOCCULT GUIAC POC 1CARD (OFFICE): FECAL OCCULT BLD: NEGATIVE

## 2015-07-07 LAB — POCT URINALYSIS DIP (MANUAL ENTRY)
BILIRUBIN UA: NEGATIVE
BILIRUBIN UA: NEGATIVE
GLUCOSE UA: NEGATIVE
Leukocytes, UA: NEGATIVE
Nitrite, UA: POSITIVE — AB
PH UA: 6
Protein Ur, POC: NEGATIVE
Urobilinogen, UA: 0.2

## 2015-07-07 LAB — POC MICROSCOPIC URINALYSIS (UMFC): Mucus: ABSENT

## 2015-07-07 LAB — IFOBT (OCCULT BLOOD): IFOBT: NEGATIVE

## 2015-07-07 MED ORDER — AMOXICILLIN-POT CLAVULANATE 875-125 MG PO TABS: 1.0000 | ORAL_TABLET | Freq: Two times a day (BID) | ORAL | Status: DC

## 2015-07-07 MED ORDER — GLYCERIN (LAXATIVE) 2 G RE SUPP: 1.0000 | Freq: Once | RECTAL | Status: DC

## 2015-07-07 MED ORDER — POLYETHYLENE GLYCOL 3350 17 GM/SCOOP PO POWD: 17.0000 g | Freq: Two times a day (BID) | ORAL | Status: AC | PRN

## 2015-07-07 NOTE — Progress Notes (Addendum)
Subjective:  By signing my name below, I, Raven Small, attest that this documentation has been prepared under the direction and in the presence of Norberto Sorenson, MD.  Electronically Signed: Andrew Au, ED Scribe. 07/07/2015. 8:56 AM.   Patient ID: Jodi Barr, female    DOB: 07-01-1932, 82 y.o.   MRN: [id number]  HPI Chief Complaint  Patient presents with  . other    blood in pants, not sure if urine , rectal or vaginal    HPI Comments: Jodi Barr is a 82 y.o. female who presents to the [facility] complaining of bright red blood in undergarments noticed last night. When pt went to urinate last night she noticed bright red blood in undergarments. She is unsure if this is vaginal or rectal bleeding. She also had lower abdominal pain, abdominal bloating and CP. This morning she only had a small amount of blood in undergarments but still has lower abdominal pain and bloating. Although CP has subsided she reports having hx of CP in the past. Pt states she is always constipated and denies changes in stool. She reports hx of hemorrhoids several years ago. She denies changes in appetite, urinary symptoms, blood in stool and melena. 3-4 hours of HA once oafter taking penicillin dose decades ago. No hx of angioedema urticaria . She was started on zpak 6 weeks ago for a UTI and clindamycin 3 months ago which she had difficulty tolerating. .   Patient Active Problem List   Diagnosis Date Noted  . ABNORMAL FINDINGS GI TRACT 08/13/2009  . HYPERLIPIDEMIA 08/07/2009  . HYPERTENSION 08/07/2009  . HEARTBURN 08/07/2009  . ABDOMINAL BLOATING 08/07/2009  . RLQ PAIN 08/07/2009  . ABDOMINAL PAIN, LEFT LOWER QUADRANT 08/07/2009  . PERSONAL HX COLONIC POLYPS 08/07/2009   Past Medical History  Diagnosis Date  . Arthritis   . Hypertension    History reviewed. No pertinent past surgical history. Allergies  Allergen Reactions  . Aspirin     REACTION: shortness of breath  .  Diclofenac Sodium     REACTION: Fluttering heart  . Diltiazem Hcl     REACTION: Headache  . Penicillins     REACTION: Headache  . Prednisone     REACTION: stomach pain, headache  . Tramadol Hcl     REACTION: does not remember   Prior to Admission medications   Medication Sig Start Date End Date Taking? Authorizing Provider  acetaminophen (TYLENOL) 500 MG tablet Take 500 mg by mouth every 6 (six) hours as needed.   Yes Historical Provider, MD  celecoxib (CELEBREX) 200 MG capsule Take 200 mg by mouth 2 (two) times daily.   Yes Historical Provider, MD  cholecalciferol (VITAMIN D) 1000 UNITS tablet Take 1,000 Units by mouth daily.   Yes Historical Provider, MD  fexofenadine (ALLEGRA) 180 MG tablet Take 180 mg by mouth daily.   Yes Historical Provider, MD  fluticasone (FLONASE) 50 MCG/ACT nasal spray Place into both nostrils daily.   Yes Historical Provider, MD  metaxalone [facility]) 800 MG tablet  02/15/13  Yes Historical Provider, MD  metoprolol succinate (TOPROL-XL) 25 MG 24 hr tablet Take 1/2 tab po prn elevated bp and rest 10/31/14  Yes Jonita Albee, MD  olmesartan-hydrochlorothiazide (BENICAR HCT) 40-12.5 MG per tablet Take 1 tablet by mouth daily.   Yes Historical Provider, MD   Social History   Social History  . Marital Status: Married    Spouse Name: N/A  . Number of Children: N/A  .  Years of Education: N/A   Occupational History  . Not on file.   Social History Main Topics  . Smoking status: Never Smoker   . Smokeless tobacco: Not on file  . Alcohol Use: No  . Drug Use: No  . Sexual Activity: Not on file   Other Topics Concern  . Not on file   Social History Narrative   Review of Systems  Constitutional: Positive for unexpected weight change. Negative for fever, chills, activity change and appetite change.  Respiratory: Negative for chest tightness.   Cardiovascular: Positive for chest pain.  Gastrointestinal: Positive for abdominal pain, constipation, abdominal  distention and anal bleeding. Negative for nausea, vomiting, diarrhea, blood in stool and rectal pain.  Genitourinary: Positive for hematuria, vaginal bleeding, vaginal pain and pelvic pain. Negative for dysuria, urgency, frequency, flank pain, vaginal discharge, difficulty urinating, genital sores and menstrual problem.  Allergic/Immunologic: Negative for food allergies and immunocompromised state.  Neurological: Negative for dizziness, light-headedness and headaches.     Objective:   Physical Exam  Constitutional: She is oriented to person, place, and time. She appears well-developed and well-nourished. No distress.  HENT:  Head: Normocephalic and atraumatic.  Eyes: Conjunctivae and EOM are normal.  Neck: Neck supple.  Cardiovascular: Normal rate.   Pulmonary/Chest: Effort normal.  Genitourinary:  Urethra is normal.  Vaginal introitus is normal.  Question of an internal hemorrhoid at 5:00. large amount of very hard pelleted stool in vault. No gross blood visualized. 1  finger vaginal exam showed vaginal wall collapsed   due to large amount of stool in vault. Pt still had tenderness with cervical motion and palpation of uterus.   Musculoskeletal: Normal range of motion.  Neurological: She is alert and oriented to person, place, and time.  Skin: Skin is warm and dry.  Psychiatric: She has a normal mood and affect. Her behavior is normal.  Nursing note and vitals reviewed.  Filed Vitals:   07/07/15 0836  BP: 118/72  Pulse: 71  Temp: 99 F (37.2 C)  TempSrc: Oral  Resp: 16  Height:  (1.549 m)  Weight: 118 lb 6 oz (53.695 kg)  SpO2: 94%   Results for orders placed or performed in visit on 07/07/15  POCT occult blood stool  Result Value Ref Range   Fecal Occult Blood, POC Negative Negative   Card #1 Date 07/07/15    Card #2 Fecal Occult Blod, POC     Card #2 Date     Card #3 Fecal Occult Blood, POC     Card #3 Date    IFOBT POC (occult bld, rslt in office)  Result  Value Ref Range   IFOBT Negative   POCT urinalysis dipstick  Result Value Ref Range   Color, UA yellow yellow   Clarity, UA clear clear   Glucose, UA negative negative   Bilirubin, UA negative negative   Ketones, POC UA negative negative   Spec Grav, UA <=1.005    Blood, UA moderate (A) negative   pH, UA 6.0    Protein Ur, POC negative negative   Urobilinogen, UA 0.2    Nitrite, UA Positive (A) Negative   Leukocytes, UA Negative Negative  POCT Microscopic Urinalysis ([facility])  Result Value Ref Range   WBC,UR,HPF,POC None None WBC/hpf   RBC,UR,HPF,POC Many (A) None RBC/hpf   Bacteria Few (A) None, Too numerous to count   Mucus Absent Absent   Epithelial Cells, UR Per Microscopy Few (A) None, Too numerous to count cells/hpf  EKG- normal sinus rhythm. No ischemic changes.  Assessment & Plan:   1. Internal hemorrhoids - small non-bleeding, non-thrombosed int hemorrhoid palpable at 5 oclock  2. Pelvic pain in female   3. Lower abdominal pain   4. Chest pain, unspecified chest pain type   5. Constipation, unspecified constipation type - pt is almost impacted but can't tolerate manual disimpaction and has not tried pr treatments so start glycerine suppositories and miralax for several d. If not improvement, try fleets enema and senokot s.  Encouraged to use supp more frequently to prevent hemorrhiodal bleeds in future  6. Acute cystitis with hematuria    Suspect the bleeding that she saw in her underwear was originating from her bladder - perhaps was a bit of urine leakage, luckily the spontaneous bleeding has already subsided.  I did a hemoccult from a digital rectal exam and after a single digit vaginal exam which were both negative while UA shows a sig amount of hematuria.   I am concerned that pt had SEVERE pain with cervical and vaginal vault examination by a single digit - more more so than rectum. This could be due to her bladder inflammation.  I encouraged pt to consider obtaining  a pelvic US as I am concerned there could be a possibility for endometrial or ovarian cancer as a cause of her severe pelvic pain, bloating, constipation but pt prefers to complete antibiotic regimen and then recheck with her PCP to see if her sxs still indicate a need for US eval at that time which I think is reasonable.   Orders Placed This Encounter  Procedures  . Urine culture  . US Pelvis Limited    DO NOT WANT A TRANSVAGINAL US - PT WOULD BE UNABLE TO TOLERATE THIS SO JUST WANT A PELVIC US TO ENSURE NO OBVIOUS OR GROSS OVARIAN ENLARGEMENT    Standing Status: Future     Number of Occurrences:      Standing Expiration Date: 09/05/2016    Order Specific Question:  Reason for Exam (SYMPTOM  OR DIAGNOSIS REQUIRED)    Answer:  BILATERAL ABDOMINAL PAIN, BLEEDING FROM PELVIC ORIFICE, SCREEN FOR OVARIAN CANCER    Order Specific Question:  Preferred imaging location?    Answer:  [facility]  . POCT occult blood stool    #1 From RECTAL  . IFOBT POC (occult bld, rslt in office)    #2 from  VAGINA  . POCT urinalysis dipstick  . POCT Microscopic Urinalysis ([facility])  . EKG 12-Lead    Meds ordered this encounter  Medications  . polyethylene glycol powder (GLYCOLAX/MIRALAX) powder    Sig: Take 17 g by mouth 2 (two) times daily as needed for mild constipation.    Dispense:  255 g    Refill:  1  . amoxicillin-clavulanate (AUGMENTIN) 875-125 MG tablet    Sig: Take 1 tablet by mouth 2 (two) times daily.    Dispense:  14 tablet    Refill:  0  . glycerin adult (GLYCERIN ADULT) 2 g SUPP    Sig: Place 1 suppository rectally once.    Dispense:  25 each    Refill:  0    I personally performed the services described in this documentation, which was scribed in my presence. The recorded information has been reviewed and considered, and addended by me as needed.  Norberto SorensonEva Chelcee Korpi, MD MPH

## 2015-07-07 NOTE — Patient Instructions (Addendum)
IF you received an x-ray today, you will receive an invoice from [facility]. Please contact [facility] at [phone] with questions or concerns regarding your invoice.   IF you received labwork today, you will receive an invoice from [facility]. Please contact [facility] at [phone] with questions or concerns regarding your invoice.   Our billing staff will not be able to assist you with questions regarding bills from these companies.  You will be contacted with the lab results as soon as they are available. The fastest way to get your results is to activate your My Chart account. Instructions are located on the last page of this paperwork. If you have not heard from us regarding the results in 2 weeks, please contact this office.    Try glycerin suppositories while you are starting with miralax 2 capfuls a day.  If this doesn't work, switch to senokot S 1 twice a day with a fleet's enema.  Recheck in 2 weeks to ensure your pelvic tenderness has resolved - I want to make sure that there is no chance of uterine or ovarian enlargement contributing to your problems though it is likely just the chronic constipation causing the bladder infection.  Urinary Tract Infection Urinary tract infections (UTIs) can develop anywhere along your urinary tract. Your urinary tract is your body's drainage system for removing wastes and extra water. Your urinary tract includes two kidneys, two ureters, a bladder, and a urethra. Your kidneys are a pair of bean-shaped organs. Each kidney is about the size of your fist. They are located below your ribs, one on each side of your spine. CAUSES Infections are caused by microbes, which are microscopic organisms, including fungi, viruses, and bacteria. These organisms are so small that they can only be seen through a microscope. Bacteria are the microbes that most commonly cause UTIs. SYMPTOMS  Symptoms of UTIs may  vary by age and gender of the patient and by the location of the infection. Symptoms in young women typically include a frequent and intense urge to urinate and a painful, burning feeling in the bladder or urethra during urination. Older women and men are more likely to be tired, shaky, and weak and have muscle aches and abdominal pain. A fever may mean the infection is in your kidneys. Other symptoms of a kidney infection include pain in your back or sides below the ribs, nausea, and vomiting. DIAGNOSIS To diagnose a UTI, your caregiver will ask you about your symptoms. Your caregiver will also ask you to provide a urine sample. The urine sample will be tested for bacteria and white blood cells. White blood cells are made by your body to help fight infection. TREATMENT  Typically, UTIs can be treated with medication. Because most UTIs are caused by a bacterial infection, they usually can be treated with the use of antibiotics. The choice of antibiotic and length of treatment depend on your symptoms and the type of bacteria causing your infection. HOME CARE INSTRUCTIONS  If you were prescribed antibiotics, take them exactly as your caregiver instructs you. Finish the medication even if you feel better after you have only taken some of the medication.  Drink enough water and fluids to keep your urine clear or pale yellow.  Avoid caffeine, tea, and carbonated beverages. They tend to irritate your bladder.  Empty your bladder often. Avoid holding urine for long periods of time.  Empty your bladder before and after sexual intercourse.  After a bowel  movement, women should cleanse from front to back. Use each tissue only once. SEEK MEDICAL CARE IF:   You have back pain.  You develop a fever.  Your symptoms do not begin to resolve within 3 days. SEEK IMMEDIATE MEDICAL CARE IF:   You have severe back pain or lower abdominal pain.  You develop chills.  You have nausea or vomiting.  You have  continued burning or discomfort with urination. MAKE SURE YOU:   Understand these instructions.  Will watch your condition.  Will get help right away if you are not doing well or get worse.   This information is not intended to replace advice given to you by your health care provider. Make sure you discuss any questions you have with your health care provider.   Document Released: 12/04/2004 Document Revised: 11/15/2014 Document Reviewed: 04/04/2011 Elsevier Interactive Patient Education [employer]

## 2015-07-08 LAB — URINE CULTURE
Colony Count: NO GROWTH
ORGANISM ID, BACTERIA: NO GROWTH

## 2015-07-10 ENCOUNTER — Telehealth: Payer: Self-pay

## 2015-07-10 NOTE — Telephone Encounter (Signed)
Pt states she was given AMOXICILLIN 875-125 MG the other day and she can't take it, is not agreeing with her. Please call [phone]      [facility] [address]

## 2015-07-10 NOTE — Telephone Encounter (Signed)
Called pt to get symptoms. Left message for pt to call back.

## 2015-07-10 NOTE — Telephone Encounter (Signed)
Dr. Clelia CroftShaw patient states the amoxicillin gave her diarrhea,headache,abdominal pain.  She is not taking it anymore, and feels her symptoms are gone does not want anything else.  Advised to come back in if symptoms return.

## 2015-07-10 NOTE — Telephone Encounter (Signed)
Im sorry she had so many side effects but urine culture was actually negative so fine to stop and agree that no indication for another antibiotic at this time. She had already planned to f/u with her PCP.

## 2015-07-12 NOTE — Telephone Encounter (Signed)
Left message for pt to call back  °

## 2015-07-13 ENCOUNTER — Encounter: Payer: Self-pay | Admitting: Gastroenterology

## 2015-07-26 ENCOUNTER — Ambulatory Visit (INDEPENDENT_AMBULATORY_CARE_PROVIDER_SITE_OTHER): Payer: Medicare Other

## 2015-07-26 ENCOUNTER — Ambulatory Visit (INDEPENDENT_AMBULATORY_CARE_PROVIDER_SITE_OTHER): Payer: Medicare Other | Admitting: Podiatry

## 2015-07-26 ENCOUNTER — Encounter: Payer: Self-pay | Admitting: Podiatry

## 2015-07-26 VITALS — BP 116/67 | HR 66 | Resp 16

## 2015-07-26 DIAGNOSIS — M779 Enthesopathy, unspecified: Secondary | ICD-10-CM | POA: Diagnosis not present

## 2015-07-26 DIAGNOSIS — M79671 Pain in right foot: Secondary | ICD-10-CM

## 2015-07-26 DIAGNOSIS — L84 Corns and callosities: Secondary | ICD-10-CM

## 2015-07-26 MED ORDER — TRIAMCINOLONE ACETONIDE 10 MG/ML IJ SUSP
10.0000 mg | Freq: Once | INTRAMUSCULAR | Status: AC
  Administered 2015-07-26: 10 mg

## 2015-07-26 NOTE — Progress Notes (Signed)
   Subjective:    Patient ID: Jodi Barr, female    DOB: [birth year], 82 y.o.   MRN: [id number]  HPI    Review of Systems  All other systems reviewed and are negative.      Objective:   Physical Exam        Assessment & Plan:

## 2015-07-26 NOTE — Progress Notes (Signed)
Subjective:     Patient ID: Jodi Barr, female   DOB: Oct 07, 1932, 82 y.o.   MRN: [id number]  HPI patient states she's developed a lot of pain under her fifth metatarsal right with fluid buildup and she's tried to trim it herself and work on it without relief and years ago she had this on her foot   Review of Systems     Objective:   Physical Exam Neurovascular status intact muscle strength adequate with inflammation around the fifth fifth MPJ right with keratotic lesion formation and pain when palpated    Assessment:     Inflammatory capsulitis fifth MPJ right with lesion formation that's thick    Plan:     Injected around the fifth MPJ 3 mg Kenalog 5 mg Xylocaine and did deep debridement of lesion which was tolerated well and reappoint as needed

## 2016-04-07 ENCOUNTER — Other Ambulatory Visit: Payer: Self-pay | Admitting: Internal Medicine

## 2016-04-07 DIAGNOSIS — G44201 Tension-type headache, unspecified, intractable: Secondary | ICD-10-CM

## 2016-04-08 ENCOUNTER — Other Ambulatory Visit: Payer: Self-pay

## 2016-04-11 ENCOUNTER — Ambulatory Visit
Admission: RE | Admit: 2016-04-11 | Discharge: 2016-04-11 | Disposition: A | Discharge status: Home / Self Care | Payer: Medicare Other | Source: Ambulatory Visit | Attending: Internal Medicine | Admitting: Internal Medicine

## 2016-04-11 DIAGNOSIS — G44201 Tension-type headache, unspecified, intractable: Secondary | ICD-10-CM

## 2016-04-22 ENCOUNTER — Ambulatory Visit: Payer: Medicare Other

## 2016-05-01 ENCOUNTER — Ambulatory Visit (INDEPENDENT_AMBULATORY_CARE_PROVIDER_SITE_OTHER): Payer: Medicare Other | Admitting: Emergency Medicine

## 2016-05-01 VITALS — BP 140/70 | HR 85 | Temp 98.3°F | Resp 14 | Ht 61.0 in | Wt 117.0 lb

## 2016-05-01 DIAGNOSIS — J0100 Acute maxillary sinusitis, unspecified: Secondary | ICD-10-CM

## 2016-05-01 DIAGNOSIS — R51 Headache: Secondary | ICD-10-CM

## 2016-05-01 DIAGNOSIS — R519 Headache, unspecified: Secondary | ICD-10-CM

## 2016-05-01 MED ORDER — DOXYCYCLINE HYCLATE 100 MG PO TABS: 100.0000 mg | ORAL_TABLET | Freq: Two times a day (BID) | ORAL | 0 refills | Status: DC

## 2016-05-01 NOTE — Patient Instructions (Addendum)
     IF you received an x-ray today, you will receive an invoice from [facility]. Please contact [facility] at [phone] with questions or concerns regarding your invoice.   IF you received labwork today, you will receive an invoice from [facility]. Please contact [facility] at [phone] with questions or concerns regarding your invoice.   Our billing staff will not be able to assist you with questions regarding bills from these companies.  You will be contacted with the lab results as soon as they are available. The fastest way to get your results is to activate your My Chart account. Instructions are located on the last page of this paperwork. If you have not heard from us regarding the results in 2 weeks, please contact this office.      Sinus Headache A sinus headache happens when your sinuses become clogged or swollen. You may feel pain or pressure in your face, forehead, ears, or upper teeth. Sinus headaches can be mild or severe. Follow these instructions at home:  Take medicines only as told by your doctor.  If you were given an antibiotic medicine, finish all of it even if you start to feel better.  Use a nose spray if you feel stuffed up (congested).  If told, apply a warm, moist washcloth to your face to help lessen pain. Contact a doctor if:  You get headaches more than one time each week.  Light or sound bothers you.  You have a fever.  You feel sick to your stomach (nauseous) or you throw up (vomit).  Your headaches do not get better with treatment. Get help right away if:  You have trouble seeing.  You suddenly have very bad pain in your face or head.  You start to twitch or shake (seizure).  You are confused.  You have a stiff neck. This information is not intended to replace advice given to you by your health care provider. Make sure you discuss any questions you have with your health care provider. Document Released: 06/26/2010  Document Revised: 10/21/2015 Document Reviewed: 02/20/2014 Elsevier Interactive Patient Education  2017 [employer]  

## 2016-05-01 NOTE — Progress Notes (Signed)
Jodi Barr 83 y.o.   Chief Complaint  Patient presents with  . Sinusitis  . Nasal Congestion  . Facial Pain    HISTORY OF PRESENT ILLNESS: This is a 83 y.o. female complaining of sinus headache, drainage, and congestion "since last fall".  Sinusitis  This is a new problem. The current episode started more than 1 month ago. The problem has been waxing and waning since onset. There has been no fever. Her pain is at a severity of 6/10. The pain is moderate. Associated symptoms include congestion, ear pain, headaches (tight rope around head), neck pain (neck arthritis) and sinus pressure. Pertinent negatives include no chills, coughing, shortness of breath, sore throat or swollen glands.     Prior to Admission medications   Medication Sig Start Date End Date Taking? Authorizing Provider  acetaminophen (TYLENOL) 500 MG tablet Take 500 mg by mouth every 6 (six) hours as needed.   Yes Historical Provider, MD  celecoxib (CELEBREX) 200 MG capsule Take 200 mg by mouth 2 (two) times daily.   Yes Historical Provider, MD  cholecalciferol (VITAMIN D) 1000 UNITS tablet Take 1,000 Units by mouth daily.   Yes Historical Provider, MD  fexofenadine (ALLEGRA) 180 MG tablet Take 180 mg by mouth daily.   Yes Historical Provider, MD  fluticasone (FLONASE) 50 MCG/ACT nasal spray Place into both nostrils daily.   Yes Historical Provider, MD  metaxalone [facility]) 800 MG tablet  02/15/13  Yes Historical Provider, MD  metoprolol succinate (TOPROL-XL) 25 MG 24 hr tablet Take 1/2 tab po prn elevated bp and rest 10/31/14  Yes Jonita Albee, MD  olmesartan-hydrochlorothiazide (BENICAR HCT) 40-12.5 MG per tablet Take 1 tablet by mouth daily.   Yes Historical Provider, MD  polyethylene glycol powder (GLYCOLAX/MIRALAX) powder Take 17 g by mouth 2 (two) times daily as needed for mild constipation. 07/07/15  Yes Sherren Mocha, MD  Probiotic Product (ALIGN PO) Take by mouth daily.   Yes Historical Provider, MD     Allergies  Allergen Reactions  . Aspirin     REACTION: shortness of breath  . Diclofenac Sodium     REACTION: Fluttering heart  . Diltiazem Hcl     REACTION: Headache  . Penicillins     REACTION: Headache  . Prednisone     REACTION: stomach pain, headache  . Tramadol Hcl     REACTION: does not remember    Patient Active Problem List   Diagnosis Date Noted  . ABNORMAL FINDINGS GI TRACT 08/13/2009  . HYPERLIPIDEMIA 08/07/2009  . HYPERTENSION 08/07/2009  . HEARTBURN 08/07/2009  . ABDOMINAL BLOATING 08/07/2009  . RLQ PAIN 08/07/2009  . ABDOMINAL PAIN, LEFT LOWER QUADRANT 08/07/2009  . PERSONAL HX COLONIC POLYPS 08/07/2009    Past Medical History:  Diagnosis Date  . Arthritis   . Hypertension     No past surgical history on file.  Social History   Social History  . Marital status: Married    Spouse name: N/A  . Number of children: N/A  . Years of education: N/A   Occupational History  . Not on file.   Social History Main Topics  . Smoking status: Never Smoker  . Smokeless tobacco: Never Used  . Alcohol use No  . Drug use: No  . Sexual activity: Not on file   Other Topics Concern  . Not on file   Social History Narrative  . No narrative on file    No family history on file.   Review  of Systems  Constitutional: Negative for chills, fever and malaise/fatigue.  HENT: Positive for congestion, ear pain, sinus pain and sinus pressure. Negative for nosebleeds and sore throat.   Eyes: Negative for blurred vision, double vision, pain, discharge and redness.  Respiratory: Negative for cough and shortness of breath.   Cardiovascular: Negative for chest pain, palpitations and leg swelling.  Gastrointestinal: Negative for abdominal pain, diarrhea, nausea and vomiting.  Genitourinary: Negative for dysuria and hematuria.  Musculoskeletal: Positive for neck pain (neck arthritis). Negative for myalgias.  Skin: Negative for rash.  Neurological: Positive for  headaches (tight rope around head). Negative for dizziness, sensory change and focal weakness.  All other systems reviewed and are negative.    Physical Exam  Constitutional: She is oriented to person, place, and time. She appears well-developed and well-nourished.  HENT:  Head: Normocephalic and atraumatic.  Right Ear: Hearing, tympanic membrane, external ear and ear canal normal.  Left Ear: Hearing, tympanic membrane, external ear and ear canal normal.  Nose: Right sinus exhibits maxillary sinus tenderness. Left sinus exhibits maxillary sinus tenderness.  Mouth/Throat: Oropharynx is clear and moist. No oropharyngeal exudate.  Eyes: Conjunctivae and EOM are normal. Pupils are equal, round, and reactive to light.  Neck: No JVD present. No thyromegaly present.  Limited ROM (chronic)  Cardiovascular: Normal rate, regular rhythm and normal heart sounds.   Pulmonary/Chest: Effort normal and breath sounds normal.  Abdominal: Soft. She exhibits no distension. There is no tenderness.  Musculoskeletal: Normal range of motion.  Lymphadenopathy:    She has no cervical adenopathy.  Neurological: She is alert and oriented to person, place, and time. No sensory deficit. She exhibits normal muscle tone.  Skin: Skin is warm and dry. Capillary refill takes less than 2 seconds.  Psychiatric: She has a normal mood and affect. Her behavior is normal.  Vitals reviewed.    ASSESSMENT & PLAN: Cheryln ManlyJeanene was seen today for sinusitis, nasal congestion and facial pain.  Diagnoses and all orders for this visit:  Acute non-recurrent maxillary sinusitis -     CT Maxillofacial WO CM; Future -     CT Head Wo Contrast; Future  Sinus headache -     CT Maxillofacial WO CM; Future -     CT Head Wo Contrast; Future  Other orders -     doxycycline (VIBRA-TABS) 100 MG tablet; Take 1 tablet (100 mg total) by mouth 2 (two) times daily.    Patient Instructions       IF you received an x-ray today, you will  receive an invoice from [facility]. Please contact [facility] at [phone] with questions or concerns regarding your invoice.   IF you received labwork today, you will receive an invoice from [facility]. Please contact [facility] at [phone] with questions or concerns regarding your invoice.   Our billing staff will not be able to assist you with questions regarding bills from these companies.  You will be contacted with the lab results as soon as they are available. The fastest way to get your results is to activate your My Chart account. Instructions are located on the last page of this paperwork. If you have not heard from us regarding the results in 2 weeks, please contact this office.     Sinus Headache A sinus headache happens when your sinuses become clogged or swollen. You may feel pain or pressure in your face, forehead, ears, or upper teeth. Sinus headaches can be mild or severe. Follow these instructions at home:  Take medicines only as told by your doctor.  If you were given an antibiotic medicine, finish all of it even if you start to feel better.  Use a nose spray if you feel stuffed up (congested).  If told, apply a warm, moist washcloth to your face to help lessen pain. Contact a doctor if:  You get headaches more than one time each week.  Light or sound bothers you.  You have a fever.  You feel sick to your stomach (nauseous) or you throw up (vomit).  Your headaches do not get better with treatment. Get help right away if:  You have trouble seeing.  You suddenly have very bad pain in your face or head.  You start to twitch or shake (seizure).  You are confused.  You have a stiff neck. This information is not intended to replace advice given to you by your health care provider. Make sure you discuss any questions you have with your health care provider. Document Released: 06/26/2010 Document Revised: 10/21/2015 Document Reviewed:  02/20/2014 Elsevier Interactive Patient Education  2017 Elsevier Inc.      Edwina Barth, MD [facility]

## 2016-08-22 ENCOUNTER — Ambulatory Visit (INDEPENDENT_AMBULATORY_CARE_PROVIDER_SITE_OTHER): Payer: Medicare Other | Admitting: Podiatry

## 2016-08-22 ENCOUNTER — Encounter: Payer: Self-pay | Admitting: Podiatry

## 2016-08-22 DIAGNOSIS — M21621 Bunionette of right foot: Secondary | ICD-10-CM | POA: Diagnosis not present

## 2016-08-22 DIAGNOSIS — L84 Corns and callosities: Secondary | ICD-10-CM | POA: Diagnosis not present

## 2016-08-22 NOTE — Progress Notes (Signed)
Subjective:    Patient ID: Jodi Barr, female   DOB: 83 y.o.   MRN: [id number]   HPI patient presents with painful lesion underneath the fifth metatarsal head right with large keratotic tissue formation that's hard for her to walk with. States that it's not hurting her as much it's just thick    ROS      Objective:  Physical Exam neurovascular status intact with patient noted to have a large keratotic lesion sub-fifth metatarsal right with mild fluid buildup underneath it and structural plantar flexion of the fifth metatarsal     Assessment:    Lesion secondary to plantar flexed fifth metatarsal with tailor's bunion deformity and prominent metatarsal head     Plan:   H&P condition reviewed and deep debridement of lesion accomplished with no iatrogenic bleeding. I then discussed fifth metatarsal head resection which may be necessary one point in future

## 2017-11-05 DIAGNOSIS — M25551 Pain in right hip: Secondary | ICD-10-CM | POA: Insufficient documentation

## 2017-11-16 ENCOUNTER — Ambulatory Visit (HOSPITAL_BASED_OUTPATIENT_CLINIC_OR_DEPARTMENT_OTHER)
Admission: RE | Admit: 2017-11-16 | Discharge: 2017-11-16 | Disposition: A | Discharge status: Home / Self Care | Payer: Medicare Other | Source: Ambulatory Visit | Attending: Internal Medicine | Admitting: Internal Medicine

## 2017-11-16 ENCOUNTER — Other Ambulatory Visit (HOSPITAL_BASED_OUTPATIENT_CLINIC_OR_DEPARTMENT_OTHER): Payer: Self-pay | Admitting: Internal Medicine

## 2017-11-16 ENCOUNTER — Encounter (HOSPITAL_BASED_OUTPATIENT_CLINIC_OR_DEPARTMENT_OTHER): Payer: Self-pay

## 2017-11-16 DIAGNOSIS — M8580 Other specified disorders of bone density and structure, unspecified site: Secondary | ICD-10-CM | POA: Insufficient documentation

## 2017-11-16 DIAGNOSIS — M438X4 Other specified deforming dorsopathies, thoracic region: Secondary | ICD-10-CM | POA: Insufficient documentation

## 2017-11-16 DIAGNOSIS — R109 Unspecified abdominal pain: Secondary | ICD-10-CM

## 2017-11-16 DIAGNOSIS — R112 Nausea with vomiting, unspecified: Secondary | ICD-10-CM

## 2017-11-16 MED ORDER — IOPAMIDOL (ISOVUE-300) INJECTION 61%
100.0000 mL | Freq: Once | INTRAVENOUS | Status: AC | PRN
  Administered 2017-11-16: 100 mL via INTRAVENOUS

## 2018-01-18 ENCOUNTER — Ambulatory Visit: Payer: Medicare Other | Admitting: Podiatry

## 2018-01-18 ENCOUNTER — Ambulatory Visit: Payer: Medicare Other

## 2018-01-18 ENCOUNTER — Encounter: Payer: Self-pay | Admitting: Podiatry

## 2018-01-18 DIAGNOSIS — M779 Enthesopathy, unspecified: Secondary | ICD-10-CM

## 2018-01-18 DIAGNOSIS — M79671 Pain in right foot: Secondary | ICD-10-CM

## 2018-01-18 DIAGNOSIS — L84 Corns and callosities: Secondary | ICD-10-CM

## 2018-01-18 MED ORDER — TRIAMCINOLONE ACETONIDE 10 MG/ML IJ SUSP
10.0000 mg | Freq: Once | INTRAMUSCULAR | Status: AC
  Administered 2018-01-18: 10 mg

## 2018-01-21 NOTE — Progress Notes (Signed)
Subjective:   Patient ID: Jodi Barr, female   DOB: 84 y.o.   MRN: [id number]   HPI Patient presents with chronic lesion outside of her right foot which becomes painful and is well with shaving   ROS      Objective:  Physical Exam  Neurovascular status intact with keratotic lesion sub-fifth metatarsal right that is painful     Assessment:  Chronic lesion right secondary to foot structure     Plan:  Sterile debridement accomplished no iatrogenic bleeding and this will be done as needed

## 2018-05-11 ENCOUNTER — Encounter: Payer: Self-pay | Admitting: Gastroenterology

## 2019-02-14 ENCOUNTER — Encounter: Payer: Self-pay | Admitting: Podiatry

## 2019-02-14 ENCOUNTER — Ambulatory Visit: Payer: Medicare Other | Admitting: Podiatry

## 2019-02-14 ENCOUNTER — Other Ambulatory Visit: Payer: Self-pay

## 2019-02-14 DIAGNOSIS — M779 Enthesopathy, unspecified: Secondary | ICD-10-CM

## 2019-02-14 DIAGNOSIS — L84 Corns and callosities: Secondary | ICD-10-CM | POA: Diagnosis not present

## 2019-02-14 NOTE — Progress Notes (Signed)
Subjective:   Patient ID: Jodi Barr, female   DOB: 85 y.o.   MRN: [id number]   HPI Patient states she is developed inflammation and fluid around the fifth MPJ right and it is painful when pressed and makes walking difficult   ROS      Objective:  Physical Exam  Patient is noted to have an inflammatory fluid buildup around the fifth MPJ right that is painful with large thick keratotic lesion formation     Assessment:  Inflammatory capsulitis fifth MPJ right with lesion formation     Plan:  Sterile prep and injected the fifth MPJ 3 mg Dexasone Kenalog 5 mg Xylocaine and then went ahead debrided the lesion with no iatrogenic bleeding and applied sterile dressing

## 2019-08-02 DIAGNOSIS — H6122 Impacted cerumen, left ear: Secondary | ICD-10-CM | POA: Diagnosis not present

## 2019-09-26 DIAGNOSIS — M545 Low back pain: Secondary | ICD-10-CM | POA: Diagnosis not present

## 2019-09-28 DIAGNOSIS — M1990 Unspecified osteoarthritis, unspecified site: Secondary | ICD-10-CM | POA: Diagnosis not present

## 2019-09-28 DIAGNOSIS — I10 Essential (primary) hypertension: Secondary | ICD-10-CM | POA: Diagnosis not present

## 2019-09-28 DIAGNOSIS — M810 Age-related osteoporosis without current pathological fracture: Secondary | ICD-10-CM | POA: Diagnosis not present

## 2019-09-28 DIAGNOSIS — M419 Scoliosis, unspecified: Secondary | ICD-10-CM | POA: Diagnosis not present

## 2019-09-28 DIAGNOSIS — F329 Major depressive disorder, single episode, unspecified: Secondary | ICD-10-CM | POA: Diagnosis not present

## 2019-10-19 DIAGNOSIS — R140 Abdominal distension (gaseous): Secondary | ICD-10-CM | POA: Diagnosis not present

## 2019-10-19 DIAGNOSIS — M1990 Unspecified osteoarthritis, unspecified site: Secondary | ICD-10-CM | POA: Diagnosis not present

## 2019-10-27 DIAGNOSIS — Z20822 Contact with and (suspected) exposure to covid-19: Secondary | ICD-10-CM | POA: Diagnosis not present

## 2019-10-31 ENCOUNTER — Telehealth: Payer: Self-pay | Admitting: Gastroenterology

## 2019-10-31 NOTE — Telephone Encounter (Signed)
Patient reports nausea and inability to eat and weight loss.  She will come in on 11/17/19 at 2:30 with Dr. Russella Dar.  She has been added to the cancellation list

## 2019-10-31 NOTE — Telephone Encounter (Signed)
Pt is requesting a call back from a nurse, pt is experiencing stomach pain and bowel complications. Pt would like to be seen sometime soon, no appts available.

## 2019-10-31 NOTE — Telephone Encounter (Signed)
Left message for patient to call back  

## 2019-11-16 DIAGNOSIS — I10 Essential (primary) hypertension: Secondary | ICD-10-CM | POA: Diagnosis not present

## 2019-11-16 DIAGNOSIS — Z791 Long term (current) use of non-steroidal anti-inflammatories (NSAID): Secondary | ICD-10-CM | POA: Diagnosis not present

## 2019-11-16 DIAGNOSIS — M1990 Unspecified osteoarthritis, unspecified site: Secondary | ICD-10-CM | POA: Diagnosis not present

## 2019-11-16 DIAGNOSIS — J309 Allergic rhinitis, unspecified: Secondary | ICD-10-CM | POA: Diagnosis not present

## 2019-11-16 DIAGNOSIS — Z8249 Family history of ischemic heart disease and other diseases of the circulatory system: Secondary | ICD-10-CM | POA: Diagnosis not present

## 2019-11-16 DIAGNOSIS — K219 Gastro-esophageal reflux disease without esophagitis: Secondary | ICD-10-CM | POA: Diagnosis not present

## 2019-11-17 ENCOUNTER — Encounter: Payer: Self-pay | Admitting: Gastroenterology

## 2019-11-17 ENCOUNTER — Other Ambulatory Visit (INDEPENDENT_AMBULATORY_CARE_PROVIDER_SITE_OTHER): Payer: Medicare PPO

## 2019-11-17 ENCOUNTER — Ambulatory Visit: Payer: Medicare PPO | Admitting: Gastroenterology

## 2019-11-17 VITALS — BP 130/70 | HR 75 | Ht 63.0 in | Wt 111.0 lb

## 2019-11-17 DIAGNOSIS — R140 Abdominal distension (gaseous): Secondary | ICD-10-CM | POA: Diagnosis not present

## 2019-11-17 DIAGNOSIS — R634 Abnormal weight loss: Secondary | ICD-10-CM | POA: Diagnosis not present

## 2019-11-17 DIAGNOSIS — R1030 Lower abdominal pain, unspecified: Secondary | ICD-10-CM

## 2019-11-17 LAB — CBC WITH DIFFERENTIAL/PLATELET
Basophils Absolute: 0 10*3/uL (ref 0.0–0.1)
Basophils Relative: 0.6 % (ref 0.0–3.0)
Eosinophils Absolute: 0 10*3/uL (ref 0.0–0.7)
Eosinophils Relative: 0.6 % (ref 0.0–5.0)
HCT: 37.4 % (ref 36.0–46.0)
Hemoglobin: 12.5 g/dL (ref 12.0–15.0)
Lymphocytes Relative: 13.8 % (ref 12.0–46.0)
Lymphs Abs: 1 10*3/uL (ref 0.7–4.0)
MCHC: 33.5 g/dL (ref 30.0–36.0)
MCV: 94.3 fl (ref 78.0–100.0)
Monocytes Absolute: 0.6 10*3/uL (ref 0.1–1.0)
Monocytes Relative: 8.5 % (ref 3.0–12.0)
Neutro Abs: 5.8 10*3/uL (ref 1.4–7.7)
Neutrophils Relative %: 76.5 % (ref 43.0–77.0)
Platelets: 294 10*3/uL (ref 150.0–400.0)
RBC: 3.97 Mil/uL (ref 3.87–5.11)
RDW: 13.2 % (ref 11.5–15.5)
WBC: 7.5 10*3/uL (ref 4.0–10.5)

## 2019-11-17 LAB — TSH: TSH: 0.4 u[IU]/mL (ref 0.35–4.50)

## 2019-11-17 LAB — LIPASE: Lipase: 44 U/L (ref 11.0–59.0)

## 2019-11-17 MED ORDER — HYOSCYAMINE SULFATE SL 0.125 MG SL SUBL: SUBLINGUAL_TABLET | SUBLINGUAL | 5 refills | Status: AC

## 2019-11-17 NOTE — Progress Notes (Addendum)
History of Present Illness: This is an 86 year old female referred by Geoffry Paradise, MD for the evaluation of lower abdominal pain, weight loss.  She relates lower abdominal pain, worsening constipation and a 6 pound weight loss over the past several weeks.  She states at times her constipation is diet controlled but at other times it's not.  She notes her abdominal pain has improved over the past week.  She has not been taking any laxatives recently despite ongoing constipation.  She has tried Gaviscon which seems to improve symptoms.  She relates she was placed on omeprazole and after taking 3 daily doses she had significant dizziness and discontinued the medication with resolution of dizziness.  She is concerned about her weight loss and ongoing symptoms although her abdominal pain has improved.  She has had problems with intermittent lower abdominal pain in the past without a clear cause uncovered.  Most recent colonoscopy and CT AP as below.  She was evaluated at [facility] on July 21 and she was feeling well at that time.  CBC and BMP in August were remarkable for sodium of 128 and BUN of 24, otherwise unremarkable.  Colonoscopy 04/2008  3 mm colon polyp (hyperplastic), internal hemorrhoids   CT AP 11/2017 IMPRESSION: 1. No acute process in the abdomen or pelvis. 2. Mild position degradation. 3. Osteopenia with chronic T12 compression deformity.   Allergies  Allergen Reactions  . Aspirin     REACTION: shortness of breath  . Diclofenac Sodium     REACTION: Fluttering heart  . Diltiazem Hcl     REACTION: Headache  . Penicillins     REACTION: Headache  . Prednisone     REACTION: stomach pain, headache  . Tramadol Hcl     REACTION: does not remember   Outpatient Medications Prior to Visit  Medication Sig Dispense Refill  . acetaminophen (TYLENOL) 500 MG tablet Take 500 mg by mouth every 6 (six) hours as needed.    . celecoxib (CELEBREX) 200 MG capsule Take 200  mg by mouth 2 (two) times daily.    . cholecalciferol (VITAMIN D) 1000 UNITS tablet Take 1,000 Units by mouth daily.    . fexofenadine (ALLEGRA) 180 MG tablet Take 180 mg by mouth daily.    . fluticasone (FLONASE) 50 MCG/ACT nasal spray Place into both nostrils daily.    . Menthol, Topical Analgesic, (BIOFREEZE) 4 % GEL Apply 1 Dose topically as needed.    . metaxalone (SKELAXIN) 800 MG tablet     . metoprolol succinate (TOPROL-XL) 25 MG 24 hr tablet Take 1/2 tab po prn elevated bp and rest 30 tablet 3  . olmesartan-hydrochlorothiazide (BENICAR HCT) 40-12.5 MG per tablet Take 1 tablet by mouth daily.    . polyethylene glycol powder (GLYCOLAX/MIRALAX) powder Take 17 g by mouth 2 (two) times daily as needed for mild constipation. 255 g 1  . Probiotic Product (ALIGN PO) Take by mouth daily.     No facility-administered medications prior to visit.   Past Medical History:  Diagnosis Date  . Allergies   . Arthritis   . Hypertension    History reviewed. No pertinent surgical history. Social History   Socioeconomic History  . Marital status: Married    Spouse name: Not on file  . Number of children: Not on file  . Years of education: Not on file  . Highest education level: Not on file  Occupational History  . Not on file  Tobacco Use  .  Smoking status: Never Smoker  . Smokeless tobacco: Never Used  Substance and Sexual Activity  . Alcohol use: No  . Drug use: No  . Sexual activity: Not on file  Other Topics Concern  . Not on file  Social History Narrative  . Not on file   Social Determinants of Health   Financial Resource Strain:   . Difficulty of Paying Living Expenses: Not on file  Food Insecurity:   . Worried About Programme researcher, broadcasting/film/video in the Last Year: Not on file  . Ran Out of Food in the Last Year: Not on file  Transportation Needs:   . Lack of Transportation (Medical): Not on file  . Lack of Transportation (Non-Medical): Not on file  Physical Activity:   . Days of  Exercise per Week: Not on file  . Minutes of Exercise per Session: Not on file  Stress:   . Feeling of Stress : Not on file  Social Connections:   . Frequency of Communication with Friends and Family: Not on file  . Frequency of Social Gatherings with Friends and Family: Not on file  . Attends Religious Services: Not on file  . Active Member of Clubs or Organizations: Not on file  . Attends Banker Meetings: Not on file  . Marital Status: Not on file   Family History  Problem Relation Age of Onset  . Heart disease Mother       Review of Systems: Pertinent positive and negative review of systems were noted in the above HPI section. All other review of systems were otherwise negative.   Physical Exam: General: Well developed, well nourished, elderly, thin, frail, no acute distress Head: Normocephalic and atraumatic Eyes:  sclerae anicteric, EOMI Ears: Normal auditory acuity Mouth: Not examined, mask on during Covid-19 pandemic Neck: Supple, no masses or thyromegaly Lungs: Clear throughout to auscultation Heart: Regular rate and rhythm; no murmurs, rubs or bruits Abdomen: Soft, non tender and non distended. No masses, hepatosplenomegaly or hernias noted. Normal Bowel sounds Back: marked kyphoscoliosis Rectal: Not done Musculoskeletal: Symmetrical with no gross deformities  Skin: No lesions on visible extremities Pulses:  Normal pulses noted Extremities: No clubbing, cyanosis, edema or deformities noted Neurological: Alert oriented x 4, grossly nonfocal Cervical Nodes:  No significant cervical adenopathy Inguinal Nodes: No significant inguinal adenopathy Psychological:  Alert and cooperative. Anxious    Assessment and Recommendations:  1. Lower abdominal pain, constipation, weight loss. No tenderness on abdominal exam today.  Etiology unclear.  CBC, CMP, lipase, TSH.  Schedule CT AP. Miralax daily when constipated. Levsin 1-2 po q4h prn abdominal pain.  Gaviscon  or Gas-X 4 times daily as needed for abdominal pain, gas, bloating.  Further evaluation pending above results and response to treatment.  2.  Hyponatremia.  Follow-up with PCP.  3.  Kyphoscoliosis, osteoarthritis, osteoporosis.  Follow-up with PCP.   cc: Geoffry Paradise, MD [address] [place],  [place] [address]

## 2019-11-17 NOTE — Patient Instructions (Signed)
Your provider has requested that you go to the basement level for lab work before leaving today. Press "B" on the elevator. The lab is located at the first door on the left as you exit the elevator.  Start Miralax over the counter daily until bowel movements are more regular.   We have sent the following medications to your pharmacy for you to pick up at your convenience: Levsin.  You have been scheduled for a CT scan of the abdomen and pelvis at [facility], 1st floor Radiology. You are scheduled on 11/25/19  at 7:30am. You should arrive 15 minutes prior to your appointment time for registration.  Please pick up 2 bottles of contrast from [place] at least 3 days prior to your scan. The solution may taste better if refrigerated, but do NOT add ice or any other liquid to this solution. Shake well before drinking.   Please follow the written instructions below on the day of your exam:   1) Do not eat anything after 3:30am (4 hours prior to your test)   2) Drink 1 bottle of contrast @ 5:30am (2 hours prior to your exam)  Remember to shake well before drinking and do NOT pour over ice.     Drink 1 bottle of contrast @ 6:30am (1 hour prior to your exam)   You may take any medications as prescribed with a small amount of water, if necessary. If you take any of the following medications: METFORMIN, GLUCOPHAGE, GLUCOVANCE, AVANDAMET, RIOMET, FORTAMET, Opal MET, JANUMET, GLUMETZA or METAGLIP, you MAY be asked to HOLD this medication 48 hours AFTER the exam.   The purpose of you drinking the oral contrast is to aid in the visualization of your intestinal tract. The contrast solution may cause some diarrhea. Depending on your individual set of symptoms, you may also receive an intravenous injection of x-ray contrast/dye. Plan on being at [facility] for 45 minutes or longer, depending on the type of exam you are having performed.   If you have any questions regarding your exam or if you need  to reschedule, you may call [facility] at [phone] between the hours of 8:00 am and 5:00 pm, Monday-Friday.   Thank you for choosing me and [facility].  Pricilla Riffle. Dagoberto Ligas., MD., [facility]

## 2019-11-18 ENCOUNTER — Other Ambulatory Visit (INDEPENDENT_AMBULATORY_CARE_PROVIDER_SITE_OTHER): Payer: Medicare PPO

## 2019-11-18 DIAGNOSIS — R634 Abnormal weight loss: Secondary | ICD-10-CM | POA: Diagnosis not present

## 2019-11-18 DIAGNOSIS — R1030 Lower abdominal pain, unspecified: Secondary | ICD-10-CM

## 2019-11-18 LAB — COMPREHENSIVE METABOLIC PANEL
ALT: 13 U/L (ref 0–35)
AST: 23 U/L (ref 0–37)
Albumin: 4.4 g/dL (ref 3.5–5.2)
Alkaline Phosphatase: 71 U/L (ref 39–117)
BUN: 24 mg/dL — ABNORMAL HIGH (ref 6–23)
CO2: 29 mEq/L (ref 19–32)
Calcium: 9.7 mg/dL (ref 8.4–10.5)
Chloride: 91 mEq/L — ABNORMAL LOW (ref 96–112)
Creatinine, Ser: 1.18 mg/dL (ref 0.40–1.20)
GFR: 43.47 mL/min — ABNORMAL LOW (ref >60.00)
Glucose, Bld: 110 mg/dL — ABNORMAL HIGH (ref 70–99)
Potassium: 3.8 mEq/L (ref 3.5–5.1)
Sodium: 128 mEq/L — ABNORMAL LOW (ref 135–145)
Total Bilirubin: 0.4 mg/dL (ref 0.2–1.2)
Total Protein: 6.8 g/dL (ref 6.0–8.3)

## 2019-11-25 ENCOUNTER — Ambulatory Visit (HOSPITAL_COMMUNITY)
Admission: RE | Admit: 2019-11-25 | Discharge: 2019-11-25 | Disposition: A | Discharge status: Home / Self Care | Payer: Medicare PPO | Source: Ambulatory Visit | Attending: Gastroenterology | Admitting: Gastroenterology

## 2019-11-25 ENCOUNTER — Other Ambulatory Visit: Payer: Self-pay

## 2019-11-25 DIAGNOSIS — R140 Abdominal distension (gaseous): Secondary | ICD-10-CM | POA: Insufficient documentation

## 2019-11-25 DIAGNOSIS — R634 Abnormal weight loss: Secondary | ICD-10-CM | POA: Insufficient documentation

## 2019-11-25 DIAGNOSIS — R109 Unspecified abdominal pain: Secondary | ICD-10-CM | POA: Diagnosis not present

## 2019-11-25 DIAGNOSIS — R1030 Lower abdominal pain, unspecified: Secondary | ICD-10-CM | POA: Diagnosis not present

## 2019-11-25 MED ORDER — SODIUM CHLORIDE 0.9 % IV SOLN
INTRAVENOUS | Status: AC
  Filled 2019-11-25: qty 1000

## 2019-11-25 MED ORDER — IOHEXOL 300 MG/ML  SOLN
100.0000 mL | Freq: Once | INTRAMUSCULAR | Status: AC | PRN
  Administered 2019-11-25: 100 mL via INTRAVENOUS

## 2020-02-09 DIAGNOSIS — H43813 Vitreous degeneration, bilateral: Secondary | ICD-10-CM | POA: Diagnosis not present

## 2020-02-09 DIAGNOSIS — H25013 Cortical age-related cataract, bilateral: Secondary | ICD-10-CM | POA: Diagnosis not present

## 2020-02-09 DIAGNOSIS — H2513 Age-related nuclear cataract, bilateral: Secondary | ICD-10-CM | POA: Diagnosis not present

## 2020-02-09 DIAGNOSIS — H40013 Open angle with borderline findings, low risk, bilateral: Secondary | ICD-10-CM | POA: Diagnosis not present

## 2020-04-03 DIAGNOSIS — E785 Hyperlipidemia, unspecified: Secondary | ICD-10-CM | POA: Diagnosis not present

## 2020-04-03 DIAGNOSIS — M810 Age-related osteoporosis without current pathological fracture: Secondary | ICD-10-CM | POA: Diagnosis not present

## 2020-04-04 DIAGNOSIS — E785 Hyperlipidemia, unspecified: Secondary | ICD-10-CM | POA: Diagnosis not present

## 2020-04-04 DIAGNOSIS — M419 Scoliosis, unspecified: Secondary | ICD-10-CM | POA: Diagnosis not present

## 2020-04-04 DIAGNOSIS — M5416 Radiculopathy, lumbar region: Secondary | ICD-10-CM | POA: Diagnosis not present

## 2020-04-04 DIAGNOSIS — Z0000 Encounter for general adult medical examination without abnormal findings: Secondary | ICD-10-CM | POA: Diagnosis not present

## 2020-04-04 DIAGNOSIS — I10 Essential (primary) hypertension: Secondary | ICD-10-CM | POA: Diagnosis not present

## 2020-04-06 DIAGNOSIS — I10 Essential (primary) hypertension: Secondary | ICD-10-CM | POA: Diagnosis not present

## 2020-04-19 ENCOUNTER — Ambulatory Visit: Payer: Medicare PPO | Admitting: Podiatry

## 2020-04-19 ENCOUNTER — Ambulatory Visit (INDEPENDENT_AMBULATORY_CARE_PROVIDER_SITE_OTHER): Payer: Medicare PPO

## 2020-04-19 ENCOUNTER — Encounter: Payer: Self-pay | Admitting: Podiatry

## 2020-04-19 ENCOUNTER — Other Ambulatory Visit: Payer: Self-pay

## 2020-04-19 DIAGNOSIS — L84 Corns and callosities: Secondary | ICD-10-CM | POA: Diagnosis not present

## 2020-04-19 DIAGNOSIS — M779 Enthesopathy, unspecified: Secondary | ICD-10-CM

## 2020-04-19 NOTE — Progress Notes (Signed)
Subjective:   Patient ID: Jodi Barr, female   DOB: 87 y.o.   MRN: [id number]   HPI Patient presents stating the right fifth metatarsal heads been inflamed and sore with fluid buildup and a lesion has formed that is painful   ROS      Objective:  Physical Exam  Neurovascular status intact with inflammation pain with fluid buildup around the fifth MPJ with large thick keratotic lesion formation     Assessment:  Inflammatory capsulitis fifth MPJ right with pain and lesion formation     Plan:  H&P reviewed condition did sterile prep and injected around the fifth MPJ 3 mg dexamethasone Kenalog 5 mg Xylocaine did deep debridement of lesion with no iatrogenic bleeding advised on cushion type shoes and patient will be seen back to recheck  X-rays indicate that there is enlargement around the head of the fifth metatarsal head right consistent with the patient's pain in prior

## 2020-06-20 DIAGNOSIS — I10 Essential (primary) hypertension: Secondary | ICD-10-CM | POA: Diagnosis not present

## 2020-06-20 DIAGNOSIS — R112 Nausea with vomiting, unspecified: Secondary | ICD-10-CM | POA: Diagnosis not present

## 2020-06-20 DIAGNOSIS — Z1152 Encounter for screening for COVID-19: Secondary | ICD-10-CM | POA: Diagnosis not present

## 2020-06-20 DIAGNOSIS — E871 Hypo-osmolality and hyponatremia: Secondary | ICD-10-CM | POA: Diagnosis not present

## 2020-06-27 DIAGNOSIS — E785 Hyperlipidemia, unspecified: Secondary | ICD-10-CM | POA: Diagnosis not present

## 2020-06-30 DIAGNOSIS — H6122 Impacted cerumen, left ear: Secondary | ICD-10-CM | POA: Diagnosis not present

## 2020-07-02 DIAGNOSIS — M5459 Other low back pain: Secondary | ICD-10-CM | POA: Diagnosis not present

## 2020-08-15 DIAGNOSIS — M419 Scoliosis, unspecified: Secondary | ICD-10-CM | POA: Diagnosis not present

## 2020-08-15 DIAGNOSIS — M545 Low back pain, unspecified: Secondary | ICD-10-CM | POA: Diagnosis not present

## 2020-08-17 ENCOUNTER — Emergency Department (HOSPITAL_COMMUNITY): Payer: Medicare PPO

## 2020-08-17 ENCOUNTER — Inpatient Hospital Stay (HOSPITAL_COMMUNITY)
Admission: EM | Admit: 2020-08-17 | Discharge: 2020-08-29 | DRG: 100 | Disposition: A | Discharge status: Skilled Nursing Facility | Payer: Medicare PPO | Attending: Internal Medicine | Admitting: Internal Medicine

## 2020-08-17 ENCOUNTER — Inpatient Hospital Stay (HOSPITAL_COMMUNITY): Payer: Medicare PPO

## 2020-08-17 DIAGNOSIS — G319 Degenerative disease of nervous system, unspecified: Secondary | ICD-10-CM | POA: Diagnosis not present

## 2020-08-17 DIAGNOSIS — E785 Hyperlipidemia, unspecified: Secondary | ICD-10-CM | POA: Diagnosis not present

## 2020-08-17 DIAGNOSIS — Z9114 Patient's other noncompliance with medication regimen: Secondary | ICD-10-CM

## 2020-08-17 DIAGNOSIS — Z20822 Contact with and (suspected) exposure to covid-19: Secondary | ICD-10-CM | POA: Diagnosis present

## 2020-08-17 DIAGNOSIS — I6389 Other cerebral infarction: Secondary | ICD-10-CM | POA: Diagnosis not present

## 2020-08-17 DIAGNOSIS — R262 Difficulty in walking, not elsewhere classified: Secondary | ICD-10-CM | POA: Diagnosis not present

## 2020-08-17 DIAGNOSIS — R569 Unspecified convulsions: Secondary | ICD-10-CM

## 2020-08-17 DIAGNOSIS — Z86010 Personal history of colonic polyps: Secondary | ICD-10-CM

## 2020-08-17 DIAGNOSIS — G40909 Epilepsy, unspecified, not intractable, without status epilepticus: Principal | ICD-10-CM | POA: Diagnosis present

## 2020-08-17 DIAGNOSIS — R296 Repeated falls: Secondary | ICD-10-CM | POA: Diagnosis not present

## 2020-08-17 DIAGNOSIS — R000 Tachycardia, unspecified: Secondary | ICD-10-CM | POA: Diagnosis not present

## 2020-08-17 DIAGNOSIS — Z515 Encounter for palliative care: Secondary | ICD-10-CM | POA: Diagnosis not present

## 2020-08-17 DIAGNOSIS — Z888 Allergy status to other drugs, medicaments and biological substances status: Secondary | ICD-10-CM | POA: Diagnosis not present

## 2020-08-17 DIAGNOSIS — G8194 Hemiplegia, unspecified affecting left nondominant side: Secondary | ICD-10-CM | POA: Diagnosis present

## 2020-08-17 DIAGNOSIS — Z8249 Family history of ischemic heart disease and other diseases of the circulatory system: Secondary | ICD-10-CM | POA: Diagnosis not present

## 2020-08-17 DIAGNOSIS — M4856XA Collapsed vertebra, not elsewhere classified, lumbar region, initial encounter for fracture: Secondary | ICD-10-CM | POA: Diagnosis present

## 2020-08-17 DIAGNOSIS — R29818 Other symptoms and signs involving the nervous system: Secondary | ICD-10-CM | POA: Diagnosis not present

## 2020-08-17 DIAGNOSIS — W19XXXA Unspecified fall, initial encounter: Secondary | ICD-10-CM | POA: Diagnosis present

## 2020-08-17 DIAGNOSIS — R0902 Hypoxemia: Secondary | ICD-10-CM | POA: Diagnosis not present

## 2020-08-17 DIAGNOSIS — J90 Pleural effusion, not elsewhere classified: Secondary | ICD-10-CM | POA: Diagnosis not present

## 2020-08-17 DIAGNOSIS — I4891 Unspecified atrial fibrillation: Secondary | ICD-10-CM | POA: Diagnosis present

## 2020-08-17 DIAGNOSIS — M549 Dorsalgia, unspecified: Secondary | ICD-10-CM | POA: Diagnosis not present

## 2020-08-17 DIAGNOSIS — D72829 Elevated white blood cell count, unspecified: Secondary | ICD-10-CM | POA: Diagnosis not present

## 2020-08-17 DIAGNOSIS — G9341 Metabolic encephalopathy: Secondary | ICD-10-CM | POA: Diagnosis not present

## 2020-08-17 DIAGNOSIS — Z79899 Other long term (current) drug therapy: Secondary | ICD-10-CM | POA: Diagnosis not present

## 2020-08-17 DIAGNOSIS — M5030 Other cervical disc degeneration, unspecified cervical region: Secondary | ICD-10-CM

## 2020-08-17 DIAGNOSIS — I10 Essential (primary) hypertension: Secondary | ICD-10-CM | POA: Diagnosis not present

## 2020-08-17 DIAGNOSIS — E871 Hypo-osmolality and hyponatremia: Secondary | ICD-10-CM | POA: Diagnosis present

## 2020-08-17 DIAGNOSIS — R32 Unspecified urinary incontinence: Secondary | ICD-10-CM | POA: Diagnosis present

## 2020-08-17 DIAGNOSIS — R4182 Altered mental status, unspecified: Secondary | ICD-10-CM | POA: Diagnosis not present

## 2020-08-17 DIAGNOSIS — R404 Transient alteration of awareness: Secondary | ICD-10-CM | POA: Diagnosis not present

## 2020-08-17 DIAGNOSIS — K219 Gastro-esophageal reflux disease without esophagitis: Secondary | ICD-10-CM | POA: Diagnosis present

## 2020-08-17 DIAGNOSIS — E872 Acidosis: Secondary | ICD-10-CM | POA: Diagnosis not present

## 2020-08-17 DIAGNOSIS — M4854XA Collapsed vertebra, not elsewhere classified, thoracic region, initial encounter for fracture: Secondary | ICD-10-CM | POA: Diagnosis present

## 2020-08-17 DIAGNOSIS — E860 Dehydration: Secondary | ICD-10-CM | POA: Diagnosis not present

## 2020-08-17 DIAGNOSIS — Z7189 Other specified counseling: Secondary | ICD-10-CM | POA: Diagnosis not present

## 2020-08-17 DIAGNOSIS — R29898 Other symptoms and signs involving the musculoskeletal system: Secondary | ICD-10-CM | POA: Diagnosis not present

## 2020-08-17 DIAGNOSIS — R339 Retention of urine, unspecified: Secondary | ICD-10-CM | POA: Diagnosis not present

## 2020-08-17 DIAGNOSIS — R9389 Abnormal findings on diagnostic imaging of other specified body structures: Secondary | ICD-10-CM | POA: Diagnosis not present

## 2020-08-17 DIAGNOSIS — M545 Low back pain, unspecified: Secondary | ICD-10-CM | POA: Diagnosis present

## 2020-08-17 DIAGNOSIS — E049 Nontoxic goiter, unspecified: Secondary | ICD-10-CM | POA: Diagnosis not present

## 2020-08-17 DIAGNOSIS — Z880 Allergy status to penicillin: Secondary | ICD-10-CM

## 2020-08-17 DIAGNOSIS — R2681 Unsteadiness on feet: Secondary | ICD-10-CM | POA: Diagnosis not present

## 2020-08-17 DIAGNOSIS — M542 Cervicalgia: Secondary | ICD-10-CM | POA: Diagnosis not present

## 2020-08-17 DIAGNOSIS — R627 Adult failure to thrive: Secondary | ICD-10-CM | POA: Diagnosis not present

## 2020-08-17 DIAGNOSIS — Z66 Do not resuscitate: Secondary | ICD-10-CM | POA: Diagnosis not present

## 2020-08-17 DIAGNOSIS — I959 Hypotension, unspecified: Secondary | ICD-10-CM | POA: Diagnosis not present

## 2020-08-17 DIAGNOSIS — R456 Violent behavior: Secondary | ICD-10-CM | POA: Diagnosis not present

## 2020-08-17 DIAGNOSIS — R29810 Facial weakness: Secondary | ICD-10-CM | POA: Diagnosis not present

## 2020-08-17 DIAGNOSIS — G9340 Encephalopathy, unspecified: Secondary | ICD-10-CM | POA: Diagnosis not present

## 2020-08-17 DIAGNOSIS — Z886 Allergy status to analgesic agent status: Secondary | ICD-10-CM | POA: Diagnosis not present

## 2020-08-17 DIAGNOSIS — R109 Unspecified abdominal pain: Secondary | ICD-10-CM | POA: Diagnosis not present

## 2020-08-17 DIAGNOSIS — J9811 Atelectasis: Secondary | ICD-10-CM | POA: Diagnosis not present

## 2020-08-17 DIAGNOSIS — S22080D Wedge compression fracture of T11-T12 vertebra, subsequent encounter for fracture with routine healing: Secondary | ICD-10-CM | POA: Diagnosis not present

## 2020-08-17 DIAGNOSIS — Z743 Need for continuous supervision: Secondary | ICD-10-CM | POA: Diagnosis not present

## 2020-08-17 DIAGNOSIS — R531 Weakness: Secondary | ICD-10-CM | POA: Diagnosis not present

## 2020-08-17 DIAGNOSIS — R918 Other nonspecific abnormal finding of lung field: Secondary | ICD-10-CM

## 2020-08-17 DIAGNOSIS — R400 Somnolence: Secondary | ICD-10-CM | POA: Diagnosis not present

## 2020-08-17 DIAGNOSIS — K5900 Constipation, unspecified: Secondary | ICD-10-CM | POA: Diagnosis present

## 2020-08-17 DIAGNOSIS — E041 Nontoxic single thyroid nodule: Secondary | ICD-10-CM | POA: Diagnosis not present

## 2020-08-17 DIAGNOSIS — I517 Cardiomegaly: Secondary | ICD-10-CM | POA: Diagnosis not present

## 2020-08-17 DIAGNOSIS — R4020 Unspecified coma: Secondary | ICD-10-CM | POA: Diagnosis not present

## 2020-08-17 LAB — DIFFERENTIAL
Abs Immature Granulocytes: 0.16 10*3/uL — ABNORMAL HIGH (ref 0.00–0.07)
Basophils Absolute: 0 10*3/uL (ref 0.0–0.1)
Basophils Relative: 0 %
Eosinophils Absolute: 0 10*3/uL (ref 0.0–0.5)
Eosinophils Relative: 0 %
Immature Granulocytes: 1 %
Lymphocytes Relative: 2 %
Lymphs Abs: 0.5 10*3/uL — ABNORMAL LOW (ref 0.7–4.0)
Monocytes Absolute: 1.6 10*3/uL — ABNORMAL HIGH (ref 0.1–1.0)
Monocytes Relative: 8 %
Neutro Abs: 17.7 10*3/uL — ABNORMAL HIGH (ref 1.7–7.7)
Neutrophils Relative %: 89 %

## 2020-08-17 LAB — RAPID URINE DRUG SCREEN, HOSP PERFORMED
Amphetamines: NOT DETECTED
Barbiturates: NOT DETECTED
Benzodiazepines: NOT DETECTED
Cocaine: NOT DETECTED
Opiates: NOT DETECTED
Tetrahydrocannabinol: NOT DETECTED

## 2020-08-17 LAB — I-STAT CHEM 8, ED
BUN: 14 mg/dL (ref 8–23)
Calcium, Ion: 1.04 mmol/L — ABNORMAL LOW (ref 1.15–1.40)
Chloride: 88 mmol/L — ABNORMAL LOW (ref 98–111)
Creatinine, Ser: 0.9 mg/dL (ref 0.44–1.00)
Glucose, Bld: 176 mg/dL — ABNORMAL HIGH (ref 70–99)
HCT: 41 % (ref 36.0–46.0)
Hemoglobin: 13.9 g/dL (ref 12.0–15.0)
Potassium: 3.9 mmol/L (ref 3.5–5.1)
Sodium: 121 mmol/L — ABNORMAL LOW (ref 135–145)
TCO2: 21 mmol/L — ABNORMAL LOW (ref 22–32)

## 2020-08-17 LAB — ECHOCARDIOGRAM COMPLETE
AR max vel: 2.4 cm2
AV Area VTI: 2.16 cm2
AV Area mean vel: 2.3 cm2
AV Mean grad: 5.5 mmHg
AV Peak grad: 9.5 mmHg
Ao pk vel: 1.55 m/s
S' Lateral: 2.3 cm
Weight: 1869.5 oz

## 2020-08-17 LAB — CBC
HCT: 39.1 % (ref 36.0–46.0)
Hemoglobin: 13.3 g/dL (ref 12.0–15.0)
MCH: 31.9 pg (ref 26.0–34.0)
MCHC: 34 g/dL (ref 30.0–36.0)
MCV: 93.8 fL (ref 80.0–100.0)
Platelets: 371 10*3/uL (ref 150–400)
RBC: 4.17 MIL/uL (ref 3.87–5.11)
RDW: 12.5 % (ref 11.5–15.5)
WBC: 20 10*3/uL — ABNORMAL HIGH (ref 4.0–10.5)
nRBC: 0 % (ref 0.0–0.2)

## 2020-08-17 LAB — FOLATE: Folate: 13.1 ng/mL (ref >5.9)

## 2020-08-17 LAB — BASIC METABOLIC PANEL
Anion gap: 11 (ref 5–15)
BUN: 11 mg/dL (ref 8–23)
CO2: 23 mmol/L (ref 22–32)
Calcium: 8.2 mg/dL — ABNORMAL LOW (ref 8.9–10.3)
Chloride: 94 mmol/L — ABNORMAL LOW (ref 98–111)
Creatinine, Ser: 0.84 mg/dL (ref 0.44–1.00)
GFR, Estimated: >60 mL/min (ref >60)
Glucose, Bld: 140 mg/dL — ABNORMAL HIGH (ref 70–99)
Potassium: 3 mmol/L — ABNORMAL LOW (ref 3.5–5.1)
Sodium: 128 mmol/L — ABNORMAL LOW (ref 135–145)

## 2020-08-17 LAB — URINALYSIS, ROUTINE W REFLEX MICROSCOPIC
Bilirubin Urine: NEGATIVE
Glucose, UA: 150 mg/dL — AB
Ketones, ur: 5 mg/dL — AB
Leukocytes,Ua: NEGATIVE
Nitrite: NEGATIVE
Protein, ur: 100 mg/dL — AB
Specific Gravity, Urine: 1.013 (ref 1.005–1.030)
pH: 7 (ref 5.0–8.0)

## 2020-08-17 LAB — APTT: aPTT: 23 seconds — ABNORMAL LOW (ref 24–36)

## 2020-08-17 LAB — COMPREHENSIVE METABOLIC PANEL
ALT: 32 U/L (ref 0–44)
AST: 44 U/L — ABNORMAL HIGH (ref 15–41)
Albumin: 3.6 g/dL (ref 3.5–5.0)
Alkaline Phosphatase: 77 U/L (ref 38–126)
Anion gap: 14 (ref 5–15)
BUN: 14 mg/dL (ref 8–23)
CO2: 19 mmol/L — ABNORMAL LOW (ref 22–32)
Calcium: 9.1 mg/dL (ref 8.9–10.3)
Chloride: 88 mmol/L — ABNORMAL LOW (ref 98–111)
Creatinine, Ser: 1.08 mg/dL — ABNORMAL HIGH (ref 0.44–1.00)
GFR, Estimated: 50 mL/min — ABNORMAL LOW (ref >60)
Glucose, Bld: 178 mg/dL — ABNORMAL HIGH (ref 70–99)
Potassium: 3.9 mmol/L (ref 3.5–5.1)
Sodium: 121 mmol/L — ABNORMAL LOW (ref 135–145)
Total Bilirubin: 0.9 mg/dL (ref 0.3–1.2)
Total Protein: 6.4 g/dL — ABNORMAL LOW (ref 6.5–8.1)

## 2020-08-17 LAB — TSH: TSH: 0.355 u[IU]/mL (ref 0.350–4.500)

## 2020-08-17 LAB — CBC WITH DIFFERENTIAL/PLATELET
Abs Immature Granulocytes: 0.15 10*3/uL — ABNORMAL HIGH (ref 0.00–0.07)
Basophils Absolute: 0 10*3/uL (ref 0.0–0.1)
Basophils Relative: 0 %
Eosinophils Absolute: 0 10*3/uL (ref 0.0–0.5)
Eosinophils Relative: 0 %
HCT: 37.4 % (ref 36.0–46.0)
Hemoglobin: 13 g/dL (ref 12.0–15.0)
Immature Granulocytes: 1 %
Lymphocytes Relative: 3 %
Lymphs Abs: 0.5 10*3/uL — ABNORMAL LOW (ref 0.7–4.0)
MCH: 31.8 pg (ref 26.0–34.0)
MCHC: 34.8 g/dL (ref 30.0–36.0)
MCV: 91.4 fL (ref 80.0–100.0)
Monocytes Absolute: 1.7 10*3/uL — ABNORMAL HIGH (ref 0.1–1.0)
Monocytes Relative: 9 %
Neutro Abs: 16.6 10*3/uL — ABNORMAL HIGH (ref 1.7–7.7)
Neutrophils Relative %: 87 %
Platelets: 348 10*3/uL (ref 150–400)
RBC: 4.09 MIL/uL (ref 3.87–5.11)
RDW: 12.5 % (ref 11.5–15.5)
WBC: 19 10*3/uL — ABNORMAL HIGH (ref 4.0–10.5)
nRBC: 0 % (ref 0.0–0.2)

## 2020-08-17 LAB — LACTIC ACID, PLASMA
Lactic Acid, Venous: 1.7 mmol/L (ref 0.5–1.9)
Lactic Acid, Venous: 2.4 mmol/L (ref 0.5–1.9)
Lactic Acid, Venous: 2.2 mmol/L (ref 0.5–1.9)

## 2020-08-17 LAB — CBG MONITORING, ED: Glucose-Capillary: 193 mg/dL — ABNORMAL HIGH (ref 70–99)

## 2020-08-17 LAB — RESP PANEL BY RT-PCR (FLU A&B, COVID) ARPGX2
Influenza A by PCR: NEGATIVE
Influenza B by PCR: NEGATIVE
SARS Coronavirus 2 by RT PCR: NEGATIVE

## 2020-08-17 LAB — BRAIN NATRIURETIC PEPTIDE: B Natriuretic Peptide: 653.8 pg/mL — ABNORMAL HIGH (ref 0.0–100.0)

## 2020-08-17 LAB — MAGNESIUM: Magnesium: 1.4 mg/dL — ABNORMAL LOW (ref 1.7–2.4)

## 2020-08-17 LAB — PROTIME-INR
INR: 1 (ref 0.8–1.2)
Prothrombin Time: 13.5 seconds (ref 11.4–15.2)

## 2020-08-17 LAB — CK: Total CK: 964 U/L — ABNORMAL HIGH (ref 38–234)

## 2020-08-17 LAB — VITAMIN B12: Vitamin B-12: 306 pg/mL (ref 180–914)

## 2020-08-17 MED ORDER — SODIUM CHLORIDE 0.9 % IV SOLN
2000.0000 mg | Freq: Once | INTRAVENOUS | Status: AC
  Administered 2020-08-17: 2000 mg via INTRAVENOUS
  Filled 2020-08-17: qty 20

## 2020-08-17 MED ORDER — IRBESARTAN 300 MG PO TABS
300.0000 mg | ORAL_TABLET | Freq: Every day | ORAL | Status: DC
  Filled 2020-08-17: qty 1

## 2020-08-17 MED ORDER — SODIUM CHLORIDE 0.9 % IV BOLUS
1000.0000 mL | Freq: Once | INTRAVENOUS | Status: AC
  Administered 2020-08-17: 1000 mL via INTRAVENOUS

## 2020-08-17 MED ORDER — SODIUM CHLORIDE 0.9 % IV SOLN: INTRAVENOUS | Status: DC

## 2020-08-17 MED ORDER — ACETAMINOPHEN 160 MG/5ML PO SOLN
650.0000 mg | ORAL | Status: DC | PRN
  Filled 2020-08-17: qty 20.3

## 2020-08-17 MED ORDER — HYDROCHLOROTHIAZIDE 12.5 MG PO CAPS: 12.5000 mg | ORAL_CAPSULE | Freq: Every day | ORAL | Status: DC

## 2020-08-17 MED ORDER — SODIUM CHLORIDE 0.9% FLUSH
3.0000 mL | Freq: Once | INTRAVENOUS | Status: AC
  Administered 2020-08-17: 3 mL via INTRAVENOUS

## 2020-08-17 MED ORDER — LEVETIRACETAM IN NACL 500 MG/100ML IV SOLN
500.0000 mg | Freq: Two times a day (BID) | INTRAVENOUS | Status: DC
  Administered 2020-08-17 – 2020-08-21 (×8): 500 mg via INTRAVENOUS
  Filled 2020-08-17 (×8): qty 100

## 2020-08-17 MED ORDER — ACETAMINOPHEN 325 MG PO TABS: 650.0000 mg | ORAL_TABLET | ORAL | Status: DC | PRN

## 2020-08-17 MED ORDER — IOHEXOL 350 MG/ML SOLN
100.0000 mL | Freq: Once | INTRAVENOUS | Status: AC | PRN
  Administered 2020-08-17: 100 mL via INTRAVENOUS

## 2020-08-17 MED ORDER — OLMESARTAN MEDOXOMIL-HCTZ 40-12.5 MG PO TABS: 1.0000 | ORAL_TABLET | Freq: Every day | ORAL | Status: DC

## 2020-08-17 MED ORDER — ACETAMINOPHEN 650 MG RE SUPP
650.0000 mg | RECTAL | Status: DC | PRN
  Filled 2020-08-17: qty 1

## 2020-08-17 MED ORDER — ENOXAPARIN SODIUM 40 MG/0.4ML IJ SOSY
40.0000 mg | PREFILLED_SYRINGE | INTRAMUSCULAR | Status: DC
  Administered 2020-08-17 – 2020-08-23 (×7): 40 mg via SUBCUTANEOUS
  Filled 2020-08-17 (×7): qty 0.4

## 2020-08-17 MED ORDER — LEVETIRACETAM IN NACL 500 MG/100ML IV SOLN: 500.0000 mg | Freq: Two times a day (BID) | INTRAVENOUS | Status: DC

## 2020-08-17 MED ORDER — POTASSIUM CHLORIDE 10 MEQ/100ML IV SOLN
10.0000 meq | INTRAVENOUS | Status: AC
  Administered 2020-08-18 (×6): 10 meq via INTRAVENOUS
  Filled 2020-08-17 (×6): qty 100

## 2020-08-17 MED ORDER — SODIUM CHLORIDE 0.9 % IV SOLN
2.0000 g | Freq: Once | INTRAVENOUS | Status: AC
  Administered 2020-08-18: 2 g via INTRAVENOUS
  Filled 2020-08-17: qty 20
  Filled 2020-08-17: qty 2

## 2020-08-17 NOTE — Progress Notes (Addendum)
Study was offline for an unspecified time, after moving from ED to floor.  LTM maint.Marland Kitchenstaff switched out computers , secured the machine and headbox, all leads intact, no skin breakdown noted. Greater than spent with maintenance.

## 2020-08-17 NOTE — ED Provider Notes (Signed)
MSE was initiated and I personally evaluated the patient and placed orders (if any) at  8:58 AM on August 17, 2020.  The patient appears stable so that the remainder of the MSE may be completed by another provider.  Came in as a code stroke. screened at the bridge by me.  Appears stable for CT scanner and further work-up.   Benjiman Core, MD 08/17/20 [phone]

## 2020-08-17 NOTE — ED Triage Notes (Signed)
Pt here as a code stroke after a fall , and developed some left sided weakness and had a seizure with ems , pt non verbal on arrival

## 2020-08-17 NOTE — Consult Note (Addendum)
Neurology consult   CC: code stroke  History is obtained from: EMS and neighbor  HPI:  Jodi Barr is an 87 yo female with a PMHx of back pain, HTN and HLD   who presented today as a code stroke. Per EMS, patient fell OOB this am when she attempted to stand. Neighbor came to check on her and 911 was called. When EMS arrived, patient was alert with no apparent injury and she did not want to come to the hospital, but decided to. When EMS put her in the ambulance, patient's eyes began to flutter with subsequent whole body shaking. Patient bit her tongue and was incontinent of urine. She was unresponsive after the seizure like activity. She had sudden onset of left sided weakness and change in AMS. Patient has no prior history of seizures or strokes per chart. It is unknown if patient hit her head when she fell.   After brief exam on the ED bridge and for airway clearance, patient was taken emergently to CT suite. CTH showed no acute finding. CTA head and neck was negative for LVO.   Due to patient not being seen by anyone since yesterday, and her fall was when she woke up, her LKW is unknown. This was also verified with her neighbor. Because of the unknown LKW and nothing acute on imaging, tPA was not given. No LVO on CTA, so no IR procedure needed.  Due to her presentation and witnessed GTC seizure, this is likely post seizure presentation, but we will also do a stroke workup. Patient has no known hx seizure. Patient was loaded with Keppra 2 grams IV x one in ED.   NP spoke with her female neighbor when she arrived at ED. Per neighbor, patient has not felt well for a few days. She has been refusing most food and po intake. Neighbor stated that patient voiced that she was dying. Patient has no family. Her husband is deceased and they had no children. Neighbors check on her throughout the day. Per neighbor, the patient lives alone and can normally perform her own ADLs up until the last few days.   LKW:  unknown.  tpa given?: No, due to being outside of window.  IR Thrombectomy?: no, no LVO.  MRS: 1  NIHSS:  1a Level of Consciousness: 0 1b LOC Questions: 2 1c LOC Commands: 2 2 Best Gaze: 0 3 Visual: 0 4 Facial Palsy: 1 5a Motor Arm - left: 2 5b Motor Arm - Right: 2 6a Motor Leg - Left: 4 6b Motor Leg - Right: 2 7 Limb Ataxia: 0 8 Sensory: 0 9 Best Language: 0 10 Dysarthria: 0 11 Extinction and Inattention: 0 TOTAL:  15  ROS: A robust ROS was unable to be performed due to emergent nature of event.   Past Medical History:  Diagnosis Date   Allergies    Arthritis    Hypertension     Family History  Problem Relation Age of Onset   Heart disease Mother    Social History:  reports that she has never smoked. She has never used smokeless tobacco. She reports that she does not drink alcohol and does not use drugs.   Prior to Admission medications   Medication Sig Start Date End Date Taking? Authorizing Provider  acetaminophen (TYLENOL) 500 MG tablet Take 500 mg by mouth every 6 (six) hours as needed.    [provider]  celecoxib (CELEBREX) 200 MG capsule Take 200 mg by mouth 2 (two) times daily.  [provider]  cholecalciferol (VITAMIN D) 1000 UNITS tablet Take 1,000 Units by mouth daily.    [provider]  fexofenadine (ALLEGRA) 180 MG tablet Take 180 mg by mouth daily.    [provider]  fluticasone (FLONASE) 50 MCG/ACT nasal spray Place into both nostrils daily.    [provider]  Hyoscyamine Sulfate SL (LEVSIN/SL) 0.125 MG SUBL Place 1-2 tablets by mouth under tongue every 4 hours 11/17/19   Meryl Dare, MD  Menthol, Topical Analgesic, (BIOFREEZE) 4 % GEL Apply 1 Dose topically as needed.    [provider]  metaxalone [facility]) 800 MG tablet  02/15/13   [provider]  metoprolol succinate (TOPROL-XL) 25 MG 24 hr tablet Take 1/2 tab po prn elevated bp and rest 10/31/14   Jonita Albee, MD   olmesartan-hydrochlorothiazide (BENICAR HCT) 40-12.5 MG per tablet Take 1 tablet by mouth daily.    [provider]  polyethylene glycol powder (GLYCOLAX/MIRALAX) powder Take 17 g by mouth 2 (two) times daily as needed for mild constipation. 07/07/15   Sherren Mocha, MD  Probiotic Product (ALIGN PO) Take by mouth daily.    [provider]    Exam: Current vital signs: BP (!) 189/112 (BP Location: Right Leg)   Pulse (!) 102   Resp 18   Wt 53 kg   BMI 20.70 kg/m   Physical Exam  Constitutional: Appears acutely ill, but not toxic. In Cspine collar due to fall.  Psych: Affect appropriate to situation. Eyes: No scleral injection. HENT: Small tongue laceration noted.  Head: Normocephalic. AF noted on tele.   Respiratory: Effort normal.  GI: Abdomen soft.  No distension. There is no tenderness.  GU: urinary incontinence.  Skin: WDI  Neuro: Parts of exam missing due to aphasia and not following commands.  Mental Status: Patient keeps her eyes closed and resists passive opening. Follows not commands. Unable to test orientation due to aphasia.  Patient is unable to give a clear and coherent history. Tends to prefer right side.  Speech/Language:  Speech is clear when she says "don't" or "no". Reptition, naming, and comprehension not intact.  Cranial Nerves: II: OD 40mm/OS 54mm. round, and reactive to light.  VII: Facial droop noted.  XIII: hearing is intact to voice.  Motor:  Drift noted in extremities x 3, except for LLE which has no effort against gravity.  Sensory: Sensation is symmetric to light touch in all fours extremities. Extinction absent to light touch DSS.  Plantars: Toes are downgoing bilaterally.  Cerebellar: No tremor noted.   I have reviewed labs in epic and the pertinent results are: INR  1.0       aPTT  23        creatinine 0.9   Glucose 178   WBCC 20K  MD reviewed the images obtained:  NCT head  1. No acute finding. Atrophy and chronic  small-vessel ischemic changes. Atherosclerotic calcification of the major vessels at the base of the brain. 2. ASPECTS is 10.  CTA head and neck and perfusion  Aortic arch: Aortic atherosclerosis. Brachiocephalic vessel origins not included in the region studied.  Right carotid system: Proximal innominate artery not included. Right common carotid artery widely patent to the bifurcation. No soft or calcified plaque at the carotid bifurcation or ICA bulb. Cervical ICA is tortuous but widely patent.Left carotid system: Left vertebral artery origin from the arch not included. Beginning at visualization, the left common carotid artery is widely patent to  the bifurcation. No soft or calcified plaque at the carotid bifurcation. Cervical ICA is tortuous but widely patent.  Vertebral arteries: Both vertebral artery origins are widely patent. Both vertebral arteries appear widely patent and normal through the cervical region to the foramen magnum. The right vertebral artery is Dominant.  Skeleton: Exaggerated cervical lordosis. No significant degenerative changes. Other neck: No lymphadenopathy. Enlarged heterogeneous thyroid gland most consistent with goiter.  Review of the MIP images confirms the above findings Remarkable absence of atherosclerotic plaque. No stenosis or irregularity. No large or medium vessel occlusion.  Brachiocephalic vessel origins from the arch not included, but given the absence of atherosclerotic disease elsewhere, disease would not be expected. Anterior circulation: Both internal carotid arteries are widely patent through the skull base and siphon regions. Minimal siphon atherosclerotic calcification but no stenosis. The anterior and middle cerebral vessels are patent. Diminutive right A1 segment, likely congenital. No large or medium vessel occlusion or correctable proximal stenosis. Fetal origin of the right PCA.  Posterior circulation: Both vertebral arteries widely  patent to the basilar. No basilar stenosis. Posterior circulation branch vessels appear normal. As noted above, fetal origin of the right PCA.  Venous sinuses: Patent and normal.  Anatomic variants: None other significant.  Review of the MIP images confirms the above findings. CT Brain Perfusion  Findings:  ASPECTS: 10  CBF (<30%) Volume: 0mL  Perfusion (Tmax>6.0s) volume: 0mL  Mismatch Volume: 0mL  Infarction Location:None  Remarkable absence of atherosclerotic plaque. No stenosis or irregularity. No large or medium vessel occlusion.  rEEG: This study is suggestive of moderate diffuse encephalopathy, nonspecific etiology. No seizures or epileptiform discharges were seen throughout the recording.  Assessment: 87 yo female who presented as a code stroke. Stroke risk factors include HTN and HLD. Imaging in CT not consistent with LVO. Given witnessed seizure activity, seizure is likely the etiology of presentation as is possible stroke. Hyponatremia at 122 on Istat, awaiting BMP to check Na.  ABCD 5, so will need DAPT.   Impression:  -Stroke vs Seizure, or both.  -Hyponatremia.   Plan: - Medicine admit.  - MRI brain without contrast. -stat rEEG with placement of long term EEG with video.  -AEDs loaded with Keppra 2 Gms x 1.  -Start maintenance AED with Keppra 500mg  po q12 hours or IV if patient is NPO.  - Recommend TTE. - Recommend labs: HbA1c, lipid panel, TSH. - Recommend Statin or increased dose if LDL > 70. - Plavix 75mg  daily when able to take PO. Allergic to aspirin. -SBP goal - Permissive hypertension first 24 h < 220/110. Hold home medications for now. - Telemetry monitoring for arrhythmia. - bedside Swallow screen. - Stroke education. - PT/OT/SLP consult. - NIHSS as per protocol. - frequent neuro checks.  -seizure precautions. -fall precautions.  - Recommend metabolic/infectious workup with UA with UCx, CXR, CK, serum lactate.   Electronically signed by: Jimmye NormanKaren  Kirby-Graham, MSN, APN-BC, nurse practitioner Pager: [phone]    Neurology Attending Attestation   I examined the patient and discussed plan with Ms. Tereso NewcomerKirby-Graham NP. Above note has been edited by me to reflect my findings and recommendations. I was present throughout the stroke code and made all significant decisions and personally reviewed CNS imaging.  LKW unknown. First-time seizure GTC w/ post-ictal lethargy and confusion w/ mild asymmetric L-sided weakness. May be 2/2 hyponatremia, recent baseline unknown. Agree with fluids to gently correct. UA/infectious w/u pending. STAT spot EEG diffusely slow w/o seizure activity. Given persistent AMS will  continue EEG overnight. Stroke w/u.   This patient is critically ill and at significant risk of neurological worsening, death and care requires constant monitoring of vital signs, hemodynamics,respiratory and cardiac monitoring, neurological assessment, discussion with family, other specialists and medical decision making of high complexity. I spent 90 minutes of neurocritical care time  in the care of  this patient. This was time spent independent of any time provided by nurse practitioner or PA.   Bing Neighbors, MD [facility] [phone]   If 7pm- 7am, please page neurology on call as listed in AMION.

## 2020-08-17 NOTE — Code Documentation (Signed)
Stroke Response Nurse Documentation Code Documentation  Jodi Barr is a 87 y.o. female arriving to [facility] ED via [facility] on 08/17/2020 with past medical hx of HTN and HLP. Code stroke was activated by EMS. Patient from home where she lives by herself. This morning, her neighbor found her on the floor outside of bed. Pt reported she fell while she was trying to get out. EMS was called and came to evaluate her. She stated she was fine and was going to refuse transport. While they were with her, pt noted to have some left sided weakness and then seizure that started at 0820.   Stroke team at the bedside on patient arrival. Labs drawn and patient cleared for CT by Dr. Rubin Payor. Patient to CT with team. NIHSS 16, see documentation for details and code stroke times. Patient with disoriented, not following commands, left facial droop, bilateral arm weakness, bilateral leg weakness, and Global aphasia  on exam. The following imaging was completed: (CT, CTA head and neck, CTP). Patient is not a candidate for tPA due to having an unclear LKW. Unknown if patient had left sided weakness that initiated her fall this morning.   Care/Plan: q2 mNIHSS/VS. Bedside handoff with ED RN [place].    Lucila Maine  Stroke Response RN

## 2020-08-17 NOTE — Progress Notes (Signed)
Received pt. Got pt comfortable on the unit bed locked in the lowest position call bell within reach.

## 2020-08-17 NOTE — Progress Notes (Signed)
Day shift RN receives a call from lab for abnormal lactic acid value of 2.4. MD Artis Flock notified, no new order received. Will continue to closely monitor. Dionne Bucy RN

## 2020-08-17 NOTE — H&P (Addendum)
History and Physical    Jodi Barr XBJ:[id number] DOB: 05-08-1932 DOA: 08/17/2020  PCP: Jodi ParadiseAronson, Richard, MD Consultants:  GI: Dr. Russella Barr, ortho: Dr. Darrelyn Barr,  Patient coming from:  Home - lives alone  Chief Complaint: witnessed seizure with left sided weakness and AMS.   HPI: Jodi Barr is a 87 y.o. female with medical history significant of HTN, HLD, GERD, chronic T12 compression fracture, constipation.  Patient non responsive, history from EMS/ER physician and her neighbor (Jodi Barr). Mr. Jodi Barr is her medical power of attorney.  Mr. Jodi Barr states she has been having back pain and was prescribed a muscle relaxer outpatient. She called him this AM and asked him to come over because she was in pain.  He states when he went to her house she had fallen down by her bed. She was not confused when he found her. He tried to move her and had some trouble so he called 911. Called EMS who states she was also not confused when they got there, but then displayed left sided weakness and had a witnessed seizure in ambulance.  Per EMS patient's eyes began to flutter with subsequent whole body shaking.  She bit her tongue and was incontinent of urine.  She was unresponsive after the seizure-like activity.  She has no known history of seizure or stroke in her chart that I can find.   ED Course: witnessed seizure and left sided weakness with EMS. Loaded with 2g IV x1 in ER. Altered, nonverbal on arrival. Code stroke called and negative CT head. Neurology consulted bedside EEG done. Vitals 189/112, HR: 102, RR: 18, oxygen: 96% RA when I was in room. CT spine normal. CT angio neck: absence of atherosclerotic plaque, no stenosis or irregularity.  Bilateral pleural effusions layering dependently.  Enlarged heterogeneous thyroid recommend thyroid ultrasound.  Pertinent labs discussed below.   Review of Systems: As per HPI; otherwise review of systems reviewed and negative.   Ambulatory Status:  ambulatory at  home with no assistance per neighbor.    Past Medical History:  Diagnosis Date   Allergies    Arthritis    Hypertension     No past surgical history on file.  Social History   Socioeconomic History   Marital status: Married    Spouse name: Not on file   Number of children: Not on file   Years of education: Not on file   Highest education level: Not on file  Occupational History   Not on file  Tobacco Use   Smoking status: Never   Smokeless tobacco: Never  Substance and Sexual Activity   Alcohol use: No   Drug use: No   Sexual activity: Not on file  Other Topics Concern   Not on file  Social History Narrative   Not on file   Social Determinants of Health   Financial Resource Strain: Not on file  Food Insecurity: Not on file  Transportation Needs: Not on file  Physical Activity: Not on file  Stress: Not on file  Social Connections: Not on file  Intimate Partner Violence: Not on file    Allergies  Allergen Reactions   Aspirin     REACTION: shortness of breath   Diclofenac Sodium     REACTION: Fluttering heart   Diltiazem Hcl     REACTION: Headache   Omeprazole    Penicillins     REACTION: Headache   Prednisone     REACTION: stomach pain, headache   Tramadol Hcl  REACTION: does not remember    Family History  Problem Relation Age of Onset   Heart disease Mother     Prior to Admission medications   Medication Sig Start Date End Date Taking? Authorizing Provider  acetaminophen (TYLENOL) 500 MG tablet Take 500 mg by mouth every 6 (six) hours as needed.    [provider]  celecoxib (CELEBREX) 200 MG capsule Take 200 mg by mouth 2 (two) times daily.    [provider]  cholecalciferol (VITAMIN D) 1000 UNITS tablet Take 1,000 Units by mouth daily.    [provider]  fexofenadine (ALLEGRA) 180 MG tablet Take 180 mg by mouth daily.    [provider]  fluticasone (FLONASE) 50 MCG/ACT nasal spray Place into both  nostrils daily.    [provider]  Hyoscyamine Sulfate SL (LEVSIN/SL) 0.125 MG SUBL Place 1-2 tablets by mouth under tongue every 4 hours 11/17/19   Meryl Dare, MD  Menthol, Topical Analgesic, (BIOFREEZE) 4 % GEL Apply 1 Dose topically as needed.    [provider]  metaxalone [facility]) 800 MG tablet  02/15/13   [provider]  metoprolol succinate (TOPROL-XL) 25 MG 24 hr tablet Take 1/2 tab po prn elevated bp and rest 10/31/14   Jonita Albee, MD  olmesartan-hydrochlorothiazide (BENICAR HCT) 40-12.5 MG per tablet Take 1 tablet by mouth daily.    [provider]  polyethylene glycol powder (GLYCOLAX/MIRALAX) powder Take 17 g by mouth 2 (two) times daily as needed for mild constipation. 07/07/15   Sherren Mocha, MD  Probiotic Product (ALIGN PO) Take by mouth daily.    [provider]    Physical Exam: Vitals:   08/17/20 0900 08/17/20 0930 08/17/20 0945 08/17/20 1000  BP: (!) 189/112 (!) 184/96 (!) 177/141 (!) 187/113  Pulse: (!) 102 100 (!) 119 (!) 113  Resp: SpO2:  92% 95% 92%  Weight: 53 kg        General:  sleeping, non arousalable. Protecting airway. C-collar in place.  Eyes:  pupils with minimal dilation bilaterally  ENT:  limited exam. Appears teeth on upper.  Neck:  in cervical collar.  Cardiovascular:  sinus tachycardia, no m/r/g. No LE edema.  Respiratory:   CTA bilaterally with no wheezes/rales/rhonchi.  Normal respiratory effort. Abdomen:  soft, NT, ND, NABS Back:   limited exam.  Skin:  no rash or induration seen on limited exam. Mild abrasions on bilateral knees from fall, bruising on left arm.  Musculoskeletal:  moving extremities.  Lower extremity:  No LE edema.  Limited foot exam with no ulcerations.  2+ distal pulses. Psychiatric:  can not assess Neurologic:  Can not assess. Will not answer questions, follow commands. DTR wnl on left upper extremity. Moving left upper extremity with good tone and strength,  does resist me. Can not evaluate her right side, lying on it. Moving bilateral legs in bed.     Radiological Exams on Admission: Independently reviewed - see discussion in A/P where applicable  CT C-SPINE NO CHARGE  Result Date: 08/17/2020 CLINICAL DATA:  Neck pain EXAM: CT CERVICAL SPINE WITHOUT CONTRAST TECHNIQUE: Multidetector CT imaging of the cervical spine was performed without intravenous contrast. Multiplanar CT image reconstructions were also generated. COMPARISON:  Vascular studies earlier today. FINDINGS: Alignment: Exaggerated cervical lordosis. Skull base and vertebrae: No fracture or primary/focal bone lesion. Soft tissues and spinal canal: Spinal canal is large in widely patent. Disc levels: No significant degenerative disc disease.  Very minimal endplate osteophytes at C5-6 but no bony encroachment upon the canal or foramina. Remarkable absence of facet osteoarthritis. Very minimal degenerative change of the left C2-3 facet. All of these findings are much less notable than usually seen at this age. The canal and foramina are widely patent. Upper chest: Bilateral effusions layering dependently with dependent atelectasis. Other: None IMPRESSION: Remarkably normal appearing cervical spine for a person of this age. Somewhat exaggerated lordosis. No significant degenerative disc disease or degenerative facet disease. No stenosis of the canal or foramina. Electronically Signed   By: Paulina Fusi M.D.   On: 08/17/2020 10:41   EEG adult  Result Date: 08/17/2020 Charlsie Quest, MD     08/17/2020 11:58 AM Patient Name: TAKERRA LUPINACCI MRN: [id number] Epilepsy Attending: Charlsie Quest Referring Physician/Provider: Jimmye Norman, NP Date: 08/17/2020 Duration: 22.51 mins Patient history: 87 year old female with seizure-like activity and left-sided weakness.  EEG generally for seizures. Level of alertness: Awake AEDs during EEG study: Keppra Technical aspects: This EEG study was done with  scalp electrodes positioned according to the 10-20 International system of electrode placement. Electrical activity was acquired at a sampling rate of 500Hz  and reviewed with a high frequency filter of 70Hz  and a low frequency filter of 1Hz . EEG data were recorded continuously and digitally stored. Description: EEG showed continuous generalized 5 to 7 Hz theta as well as intermittent generalized 2 to 3 Hz delta slowing. Hyperventilation and photic stimulation were not performed.   ABNORMALITY - Continuous slow, generalized IMPRESSION: This study is suggestive of moderate diffuse encephalopathy, nonspecific etiology. No seizures or epileptiform discharges were seen throughout the recording.   CT HEAD CODE STROKE WO CONTRAST  Result Date: 08/17/2020 CLINICAL DATA:  Code stroke. Sudden altered mental status and left-sided weakness. EXAM: CT HEAD WITHOUT CONTRAST TECHNIQUE: Contiguous axial images were obtained from the base of the skull through the vertex without intravenous contrast. COMPARISON:  None. FINDINGS: Brain: Age related atrophy. No focal abnormality seen affecting the brainstem or cerebellum. Cerebral hemispheres show generalized atrophy with chronic small-vessel ischemic changes of the white matter. No evidence of cortical or large vessel territory infarction. Vascular: There is atherosclerotic calcification of the major vessels at the base of the brain. No definite acute hyperdense vessel. Skull: Normal Sinuses/Orbits: Clear/normal Other: None ASPECTS (Alberta Stroke Program Early CT Score) - Ganglionic level infarction (caudate, lentiform nuclei, internal capsule, insula, M1-M3 cortex): 7 - Supraganglionic infarction (M4-M6 cortex): 3 Total score (0-10 with 10 being normal): 10 IMPRESSION: 1. No acute finding. Atrophy and chronic small-vessel ischemic changes. Atherosclerotic calcification of the major vessels at the base of the brain. 2. ASPECTS is 10 3. These results were  communicated to Dr. at 9:07 amon 6/10/2022by text page via the [facility] messaging system. Electronically Signed   By: Selina Cooley M.D.   On: 08/17/2020 09:08   CT ANGIO HEAD NECK W WO CM W PERF (CODE STROKE)  Result Date: 08/17/2020 CLINICAL DATA:  Code stroke presentation. Altered mental status and left-sided weakness by previous history. EXAM: CT ANGIOGRAPHY HEAD AND NECK CT PERFUSION BRAIN TECHNIQUE: Multidetector CT imaging of the head and neck was performed using the standard protocol during bolus administration of intravenous contrast. Multiplanar CT image reconstructions and MIPs were obtained to evaluate the vascular anatomy. Carotid stenosis measurements (when applicable) are obtained utilizing NASCET criteria, using the distal internal carotid diameter as the denominator. Multiphase CT imaging of the brain was performed following IV bolus contrast injection.  Subsequent parametric perfusion maps were calculated using RAPID software. CONTRAST:  OMNIPAQUE IOHEXOL 350 MG/ML SOLN COMPARISON:  Head CT earlier same day. FINDINGS: CTA NECK FINDINGS Aortic arch: Aortic atherosclerosis. Brachiocephalic vessel origins not included in the region studied. Right carotid system: Proximal innominate artery not included. Right common carotid artery widely patent to the bifurcation. No soft or calcified plaque at the carotid bifurcation or ICA bulb. Cervical ICA is tortuous but widely patent. Left carotid system: Left vertebral artery origin from the arch not included. Beginning at visualization, the left common carotid artery is widely patent to the bifurcation. No soft or calcified plaque at the carotid bifurcation. Cervical ICA is tortuous but widely patent. Vertebral arteries: Both vertebral artery origins are widely patent. Both vertebral arteries appear widely patent and normal through the cervical region to the foramen magnum. The right vertebral artery is dominant Skeleton: Exaggerated cervical  lordosis. No significant degenerative changes. Other neck: No lymphadenopathy. Enlarged heterogeneous thyroid gland most consistent with goiter. Upper chest: Bilateral pleural effusions layering dependently with dependent atelectasis. Review of the MIP images confirms the above findings CTA HEAD FINDINGS Anterior circulation: Both internal carotid arteries are widely patent through the skull base and siphon regions. Minimal siphon atherosclerotic calcification but no stenosis. The anterior and middle cerebral vessels are patent. Diminutive right A1 segment, likely congenital. No large or medium vessel occlusion or correctable proximal stenosis. Fetal origin of the right PCA. Posterior circulation: Both vertebral arteries widely patent to the basilar. No basilar stenosis. Posterior circulation branch vessels appear normal. As noted above, fetal origin of the right PCA. Venous sinuses: Patent and normal. Anatomic variants: None other significant. Review of the MIP images confirms the above findings CT Brain Perfusion Findings: ASPECTS: 10 CBF (<30%) Volume: 71mL Perfusion (Tmax>6.0s) volume: 22mL Mismatch Volume: 72mL Infarction Location:None IMPRESSION: Remarkable absence of atherosclerotic plaque. No stenosis or irregularity. No large or medium vessel occlusion. Bilateral pleural effusions layering dependently. Brachiocephalic vessel origins from the arch not included, but given the absence of atherosclerotic disease elsewhere, disease would not be expected. Enlarged heterogeneous thyroid. Recommend thyroid ultrasound (ref: J Am Coll Radiol. 2015 Feb;12(2): 143-50). Electronically Signed   By: Paulina Fusi M.D.   On: 08/17/2020 09:32    EKG: Independently reviewed.  Sinus tachycardia with rate 100; nonspecific ST changes with no evidence of acute ischemia   Labs on Admission: I have personally reviewed the available labs and imaging studies at the time of the admission.  Pertinent labs:  WBC 20 Sodium  121 Chloride 88 Creatinine: 1.08 AST: 44  CTA neck: absence of atherosclerotic plaque. No stenosis or irregularity.  -enlarged heterogeneous thyroid: recommend thyroid US  CT head stroke: negative   EEG: moderate, diffuse encephalopathy, nonspecific etiology. No seizures or epileptiform discharges seen.   Assessment/Plan  seizure  -Witnessed by EMS -CT head with no acute findings.  bedside EEG shows moderate diffuse encephalopathy nonspecific etiology but no seizures or epileptiform discharges seen. she has an overnight EEG pending -loaded with Keppra in ER. neurology on board will follow with recommendations -Etiology of seizure: Stroke, infection, electrolyte imbalance, panic attack. PRES. Labs/MRI pending. Neighbor states she has panic attack where she can not speak.  -Correcting hyponatremia baseline around 128.  Gentle fluid hydration with normal saline at 75cc/hour  -seizure precautions, fall precautions.    Acute left-sided weakness -appears to have resolved from my limited exam. Good strength, resisting me. DTR wnl.  -no apparent stroke on CT head,  MRI brain without contrast  ordered -TIA vs. Seizure -neurology on board.  -On telemetry, stroke work-up initiated. Echo ordered.  -a1c/lipid panel pending  - neurology recommended ASA  daily and clopidogrel  daily x 3 weeks.  -PT and OT will need to be ordered once patient is not on bedrest and is more responsive -can Not do bedside swallow exam until patient is more responsive keeping her at n.p.o. until more alert -Speech therapy has been ordered for both bedside swallow and cognitive language eval   Altered mental status -large differential including TIA/stroke and witnessed seizure/ post ictal state.  -hyponatremia: correcting this with gentle fluid hydration.  Repeat BMP today -was prescribed muscle relaxer's which could contribute but unsure if she has even taken these per her neighbor.  No other apparent  prescription drug as source. -UDS pending -Checking labs including TSH, CK, lactic acid, B12 and folate as well as urine and urine culture. -Cultured for source of possible infection. Lactic acid pending  -Currently n.p.o. bedrest, seizure and fall precautions     Leukocytosis -Reactive versus infection vs hemoconcentrated.  Differential not done.  Repeat and add this on -I have pancultured her waiting on urine -Chest x-ray ordered -No obvious source of infections or recorded fevers -f/u on urine, repeat CBC with differential      Hyponatremia -Baseline sodium appears to be around 128 -neurology does not think her hyponatremia is contributing to altered mental status or seizure -We will continue IV fluid hydration and recheck BMP later today -If no improvement we will do urine studies and work-up    Hyperlipidemia -Lipid panel pending not on any cholesterol medication -No known history of heart disease    Essential hypertension -Quite hypertensive on arrival.  Neighbor unsure when she last took her medication. -Holding p.o. medication at this time due to altered mental status. -Permissive hypertension first 24 hours goal less than 220/110     Enlarged thyroid -incidental finding on CT, recommended [place] -TSH/free t4 pending -thyroid ultrasound ordered   Low back pain  -per neighbor he was called over because she was in pain in her lower back. has been seeing orthopedics for lower back pain and recently put on muscle relaxer. cannot see any lumbar imaging in her chart we will start with lumbar x-ray if we are able to do this. -Known chronic T12 compression fracture.    Body mass index is 20.7 kg/m.    DVT prophylaxis:  Lovenox  Code Status:  Full-can not confirm at this time.  Family Communication: None present; I spoke with the patient's neighbor, Jodi Jodi Blazing, who is her medical POA. Advised he bring paperwork to hospital. [phone] Disposition Plan:  The patient is  from: home   Patient is currently: acutely ill  Consults called: neurology   Admission status:  inpatient    Orland Mustard MD [facility]   How to contact the [facility] Attending or Consulting provider 7A - 7P or covering provider during after hours 7P -7A, for this patient?  Check the care team in [facility] and look for a) attending/consulting [facility] provider listed and b) the [facility] team listed Log into [email or web address] and use [place]'s universal password to access. If you do not have the password, please contact the hospital operator. Locate the [facility] provider you are looking for under [facility] and page to a number that you can be directly reached. If you still have difficulty reaching the provider, please page the [facility] (Director on Call) for the Hospitalists listed on amion for assistance.  08/17/2020, 1:21 PM

## 2020-08-17 NOTE — Progress Notes (Signed)
Pt potassium 3.0 from day shift but pt MAR don't indicate pt receiving potassium during daytime. MD on-call paged and notified. No new orders yet. Will continue to closely monitor. Dionne Bucy RN

## 2020-08-17 NOTE — Progress Notes (Signed)
EEG complete - results pending 

## 2020-08-17 NOTE — ED Provider Notes (Signed)
[facility] EMERGENCY DEPARTMENT Provider Note   CSN: [id number] Arrival date & time: 08/17/20  2778  An emergency department physician performed an initial assessment on this suspected stroke patient at 0858.  History No chief complaint on file.   Jodi Barr is a 87 y.o. female.  HPI  87 year old female with past medical history of HTN, HLD presents the emergency department as a code stroke.  Patient initially evaluated EMS bridge with neuro stroke team at bedside, went straight to CAT scan.  Story from EMS is that the patient called her friend/neighbor who went over and found the patient on the floor next to her bed.  She reportedly fell.  Ambulance was called and when EMS got there the patient was verbal, declining to go to the hospital.  At that time the patient suddenly displayed left-sided weakness and had a witnessed seizure by EMS.  Patient is nonverbal and confused on initial evaluation.  Past Medical History:  Diagnosis Date   Allergies    Arthritis    Hypertension     Patient Active Problem List   Diagnosis Date Noted   Pain in joint of right hip 11/05/2017   Acute non-recurrent maxillary sinusitis 05/01/2016   ABNORMAL FINDINGS GI TRACT 08/13/2009   HYPERLIPIDEMIA 08/07/2009   HYPERTENSION 08/07/2009   HEARTBURN 08/07/2009   ABDOMINAL BLOATING 08/07/2009   RLQ PAIN 08/07/2009   ABDOMINAL PAIN, LEFT LOWER QUADRANT 08/07/2009   PERSONAL HX COLONIC POLYPS 08/07/2009    No past surgical history on file.   OB History   No obstetric history on file.     Family History  Problem Relation Age of Onset   Heart disease Mother     Social History   Tobacco Use   Smoking status: Never   Smokeless tobacco: Never  Substance Use Topics   Alcohol use: No   Drug use: No    Home Medications Prior to Admission medications   Medication Sig Start Date End Date Taking? Authorizing Provider  acetaminophen (TYLENOL) 500 MG tablet Take  500 mg by mouth every 6 (six) hours as needed.    [provider]  celecoxib (CELEBREX) 200 MG capsule Take 200 mg by mouth 2 (two) times daily.    [provider]  cholecalciferol (VITAMIN D) 1000 UNITS tablet Take 1,000 Units by mouth daily.    [provider]  fexofenadine (ALLEGRA) 180 MG tablet Take 180 mg by mouth daily.    [provider]  fluticasone (FLONASE) 50 MCG/ACT nasal spray Place into both nostrils daily.    [provider]  Hyoscyamine Sulfate SL (LEVSIN/SL) 0.125 MG SUBL Place 1-2 tablets by mouth under tongue every 4 hours 11/17/19   Meryl Dare, MD  Menthol, Topical Analgesic, (BIOFREEZE) 4 % GEL Apply 1 Dose topically as needed.    [provider]  metaxalone [facility]) 800 MG tablet  02/15/13   [provider]  metoprolol succinate (TOPROL-XL) 25 MG 24 hr tablet Take 1/2 tab po prn elevated bp and rest 10/31/14   Jonita Albee, MD  olmesartan-hydrochlorothiazide (BENICAR HCT) 40-12.5 MG per tablet Take 1 tablet by mouth daily.    [provider]  polyethylene glycol powder (GLYCOLAX/MIRALAX) powder Take 17 g by mouth 2 (two) times daily as needed for mild constipation. 07/07/15   Sherren Mocha, MD  Probiotic Product (ALIGN PO) Take by mouth daily.    [provider]    Allergies    Aspirin,  Diclofenac sodium, Diltiazem hcl, Omeprazole, Penicillins, Prednisone, and Tramadol hcl  Review of Systems   Review of Systems  Unable to perform ROS: Mental status change   Physical Exam Updated Vital Signs BP (!) 189/112 (BP Location: Right Leg)   Pulse (!) 102   Resp 18   Wt 53 kg   BMI 20.70 kg/m   Physical Exam Vitals and nursing note reviewed.  Constitutional:      Comments: Altered, protecting airway  HENT:     Head: Normocephalic.     Mouth/Throat:     Mouth: Mucous membranes are moist.  Eyes:     Pupils: Pupils are equal, round, and reactive to light.     Comments: Squeezes eyes  closed when you attempt exam  Neck:     Comments: C collar in place Cardiovascular:     Rate and Rhythm: Tachycardia present.  Pulmonary:     Effort: Pulmonary effort is normal. No respiratory distress.     Breath sounds: No stridor.  Abdominal:     Palpations: Abdomen is soft.     Tenderness: There is no abdominal tenderness.  Musculoskeletal:        General: No swelling or deformity.  Skin:    General: Skin is warm.  Neurological:     Mental Status: She is disoriented.     Motor: Weakness present.    ED Results / Procedures / Treatments   Labs (all labs ordered are listed, but only abnormal results are displayed) Labs Reviewed  APTT - Abnormal; Notable for the following components:      Result Value   aPTT 23 (*)    All other components within normal limits  CBC - Abnormal; Notable for the following components:   WBC 20.0 (*)    All other components within normal limits  DIFFERENTIAL - Abnormal; Notable for the following components:   Neutro Abs 17.7 (*)    Lymphs Abs 0.5 (*)    Monocytes Absolute 1.6 (*)    Abs Immature Granulocytes 0.16 (*)    All other components within normal limits  I-STAT CHEM 8, ED - Abnormal; Notable for the following components:   Sodium 121 (*)    Chloride 88 (*)    Glucose, Bld 176 (*)    Calcium, Ion 1.04 (*)    TCO2 21 (*)    All other components within normal limits  CBG MONITORING, ED - Abnormal; Notable for the following components:   Glucose-Capillary 193 (*)    All other components within normal limits  PROTIME-INR  COMPREHENSIVE METABOLIC PANEL    EKG None  Radiology CT HEAD CODE STROKE WO CONTRAST  Result Date: 08/17/2020 CLINICAL DATA:  Code stroke. Sudden altered mental status and left-sided weakness. EXAM: CT HEAD WITHOUT CONTRAST TECHNIQUE: Contiguous axial images were obtained from the base of the skull through the vertex without intravenous contrast. COMPARISON:  None. FINDINGS: Brain: Age related atrophy. No focal  abnormality seen affecting the brainstem or cerebellum. Cerebral hemispheres show generalized atrophy with chronic small-vessel ischemic changes of the white matter. No evidence of cortical or large vessel territory infarction. Vascular: There is atherosclerotic calcification of the major vessels at the base of the brain. No definite acute hyperdense vessel. Skull: Normal Sinuses/Orbits: Clear/normal Other: None ASPECTS (Alberta Stroke Program Early CT Score) - Ganglionic level infarction (caudate, lentiform nuclei, internal capsule, insula, M1-M3 cortex): 7 - Supraganglionic infarction (M4-M6 cortex): 3 Total score (0-10 with 10 being normal): 10 IMPRESSION: 1. No acute finding. Atrophy  and chronic small-vessel ischemic changes. Atherosclerotic calcification of the major vessels at the base of the brain. 2. ASPECTS is 10 3. These results were communicated to Dr. Selina CooleyStack at 9:07 amon 6/10/2022by text page via the [facility] messaging system. Electronically Signed   By: Paulina FusiMark  Shogry M.D.   On: 08/17/2020 09:08   CT ANGIO HEAD NECK W WO CM W PERF (CODE STROKE)  Result Date: 08/17/2020 CLINICAL DATA:  Code stroke presentation. Altered mental status and left-sided weakness by previous history. EXAM: CT ANGIOGRAPHY HEAD AND NECK CT PERFUSION BRAIN TECHNIQUE: Multidetector CT imaging of the head and neck was performed using the standard protocol during bolus administration of intravenous contrast. Multiplanar CT image reconstructions and MIPs were obtained to evaluate the vascular anatomy. Carotid stenosis measurements (when applicable) are obtained utilizing NASCET criteria, using the distal internal carotid diameter as the denominator. Multiphase CT imaging of the brain was performed following IV bolus contrast injection. Subsequent parametric perfusion maps were calculated using RAPID software. CONTRAST:  100mL OMNIPAQUE IOHEXOL 350 MG/ML SOLN COMPARISON:  Head CT earlier same day. FINDINGS: CTA NECK FINDINGS Aortic arch:  Aortic atherosclerosis. Brachiocephalic vessel origins not included in the region studied. Right carotid system: Proximal innominate artery not included. Right common carotid artery widely patent to the bifurcation. No soft or calcified plaque at the carotid bifurcation or ICA bulb. Cervical ICA is tortuous but widely patent. Left carotid system: Left vertebral artery origin from the arch not included. Beginning at visualization, the left common carotid artery is widely patent to the bifurcation. No soft or calcified plaque at the carotid bifurcation. Cervical ICA is tortuous but widely patent. Vertebral arteries: Both vertebral artery origins are widely patent. Both vertebral arteries appear widely patent and normal through the cervical region to the foramen magnum. The right vertebral artery is dominant Skeleton: Exaggerated cervical lordosis. No significant degenerative changes. Other neck: No lymphadenopathy. Enlarged heterogeneous thyroid gland most consistent with goiter. Upper chest: Bilateral pleural effusions layering dependently with dependent atelectasis. Review of the MIP images confirms the above findings CTA HEAD FINDINGS Anterior circulation: Both internal carotid arteries are widely patent through the skull base and siphon regions. Minimal siphon atherosclerotic calcification but no stenosis. The anterior and middle cerebral vessels are patent. Diminutive right A1 segment, likely congenital. No large or medium vessel occlusion or correctable proximal stenosis. Fetal origin of the right PCA. Posterior circulation: Both vertebral arteries widely patent to the basilar. No basilar stenosis. Posterior circulation branch vessels appear normal. As noted above, fetal origin of the right PCA. Venous sinuses: Patent and normal. Anatomic variants: None other significant. Review of the MIP images confirms the above findings CT Brain Perfusion Findings: ASPECTS: 10 CBF (<30%) Volume: 0mL Perfusion (Tmax>6.0s)  volume: 0mL Mismatch Volume: 0mL Infarction Location:None IMPRESSION: Remarkable absence of atherosclerotic plaque. No stenosis or irregularity. No large or medium vessel occlusion. Bilateral pleural effusions layering dependently. Brachiocephalic vessel origins from the arch not included, but given the absence of atherosclerotic disease elsewhere, disease would not be expected. Enlarged heterogeneous thyroid. Recommend thyroid ultrasound (ref: J Am Coll Radiol. 2015 Feb;12(2): 143-50). Electronically Signed   By: Paulina FusiMark  Shogry M.D.   On: 08/17/2020 09:32    Procedures Procedures   Medications Ordered in ED Medications  sodium chloride flush (NS) 0.9 % injection 3 mL (has no administration in time range)  levETIRAcetam (KEPPRA) 2,000 mg in sodium chloride 0.9 % 250 mL IVPB (2,000 mg Intravenous New Bag/Given 08/17/20 0939)  sodium chloride 0.9 % bolus 1,000 mL (  has no administration in time range)  iohexol (OMNIPAQUE) 350 MG/ML injection 100 mL (100 mLs Intravenous Contrast Given 08/17/20 0920)    ED Course  I have reviewed the triage vital signs and the nursing notes.  Pertinent labs & imaging results that were available during my care of the patient were reviewed by me and considered in my medical decision making (see chart for details).    MDM Rules/Calculators/A&P                          87 year old female presents emergency department as a stroke page for left-sided weakness and altered mental status.  This was reportedly sudden onset.  She has an high NIH is very altered.  Stroke team evaluated her at the EMS bridge, took her to CAT scan.  No signs of acute intracranial pathology.  They decided to load her with Keppra after the report with EMS.  Blood work shows a hyponatremia which the patient has had before.  She also has a white count in the 20s, flu and COVID are negative.  We are pending urinalysis.  Spoke with neurology team who recommends admission, EEG and further evaluation.  We  doubts that this hyponatremia is low/acute enough to cause a hyponatremic seizure.  On reevaluation patient notes mental status remains unchanged, however she continues to protect her airway.  Vital signs are reassuring.  Patient will be admitted to the medical team, currently EEG is being set up.  Patients evaluation and results requires admission for further treatment and care. Patient agrees with admission plan, offers no new complaints and is stable/unchanged at time of admit.  Patient's neighbor is at bedside.  She has no family.  There is a listed person in the chart as power of attorney but otherwise her next-door neighbor is the only person that knows her and will be visiting.  All other family has passed away.  Final Clinical Impression(s) / ED Diagnoses Final diagnoses:  Unwitnessed fall    Rx / DC Orders ED Discharge Orders     None        Rozelle Logan, DO 08/17/20 1238

## 2020-08-17 NOTE — Progress Notes (Signed)
Received call of lactic acid to 2.4. previously normal.  In setting of new seizure, AMS and elevated WBC, giving one dose of rocephin. Has already been cultured.  Orland Mustard, MD [facility].

## 2020-08-17 NOTE — ED Notes (Signed)
Tried to give report. Secretary said nurse will call back

## 2020-08-17 NOTE — Progress Notes (Signed)
SLP Cancellation Note  Patient Details Name: Jodi Barr MRN: [id number] DOB: 04-27-1932   Cancelled treatment:       Reason Eval/Treat Not Completed: Fatigue/lethargy limiting ability to participate (Pt's case was discussed with Cristal Deer, RN who indicated that the pt would not be able to participate in a swallow evaluation at this time. SLP will follow up on subsequent date.)  Massiel Stipp I. Vear Clock, MS, CCC-SLP Acute Rehabilitation Services Office number [phone] Pager [phone]  Scheryl Marten 08/17/2020, 3:58 PM

## 2020-08-17 NOTE — Procedures (Signed)
Patient Name: Jodi Barr  MRN: [id number]  Epilepsy Attending: Charlsie Quest  Referring Physician/Provider: Jimmye Norman, NP Date: 08/17/2020 Duration: 22.51 mins  Patient history: 87 year old female with seizure-like activity and left-sided weakness.  EEG generally for seizures.  Level of alertness: Awake  AEDs during EEG study: Keppra  Technical aspects: This EEG study was done with scalp electrodes positioned according to the 10-20 International system of electrode placement. Electrical activity was acquired at a sampling rate of 500Hz  and reviewed with a high frequency filter of 70Hz  and a low frequency filter of 1Hz . EEG data were recorded continuously and digitally stored.   Description: EEG showed continuous generalized 5 to 7 Hz theta as well as intermittent generalized 2 to 3 Hz delta slowing. Hyperventilation and photic stimulation were not performed.     ABNORMALITY - Continuous slow, generalized  IMPRESSION: This study is suggestive of moderate diffuse encephalopathy, nonspecific etiology. No seizures or epileptiform discharges were seen throughout the recording.  Jodi Barr 

## 2020-08-17 NOTE — ED Notes (Signed)
EEG at bedside.

## 2020-08-18 ENCOUNTER — Inpatient Hospital Stay (HOSPITAL_COMMUNITY): Payer: Medicare PPO

## 2020-08-18 DIAGNOSIS — D72829 Elevated white blood cell count, unspecified: Secondary | ICD-10-CM

## 2020-08-18 DIAGNOSIS — E049 Nontoxic goiter, unspecified: Secondary | ICD-10-CM

## 2020-08-18 DIAGNOSIS — M545 Low back pain, unspecified: Secondary | ICD-10-CM

## 2020-08-18 DIAGNOSIS — I10 Essential (primary) hypertension: Secondary | ICD-10-CM

## 2020-08-18 DIAGNOSIS — E785 Hyperlipidemia, unspecified: Secondary | ICD-10-CM

## 2020-08-18 DIAGNOSIS — E871 Hypo-osmolality and hyponatremia: Secondary | ICD-10-CM

## 2020-08-18 DIAGNOSIS — R4182 Altered mental status, unspecified: Secondary | ICD-10-CM

## 2020-08-18 LAB — CBC WITH DIFFERENTIAL/PLATELET
Abs Immature Granulocytes: 0.1 10*3/uL — ABNORMAL HIGH (ref 0.00–0.07)
Basophils Absolute: 0 10*3/uL (ref 0.0–0.1)
Basophils Relative: 0 %
Eosinophils Absolute: 0 10*3/uL (ref 0.0–0.5)
Eosinophils Relative: 0 %
HCT: 40.8 % (ref 36.0–46.0)
Hemoglobin: 14.3 g/dL (ref 12.0–15.0)
Immature Granulocytes: 1 %
Lymphocytes Relative: 3 %
Lymphs Abs: 0.5 10*3/uL — ABNORMAL LOW (ref 0.7–4.0)
MCH: 31.8 pg (ref 26.0–34.0)
MCHC: 35 g/dL (ref 30.0–36.0)
MCV: 90.9 fL (ref 80.0–100.0)
Monocytes Absolute: 1.9 10*3/uL — ABNORMAL HIGH (ref 0.1–1.0)
Monocytes Relative: 12 %
Neutro Abs: 13.6 10*3/uL — ABNORMAL HIGH (ref 1.7–7.7)
Neutrophils Relative %: 84 %
Platelets: 323 10*3/uL (ref 150–400)
RBC: 4.49 MIL/uL (ref 3.87–5.11)
RDW: 12.5 % (ref 11.5–15.5)
WBC: 16.2 10*3/uL — ABNORMAL HIGH (ref 4.0–10.5)
nRBC: 0 % (ref 0.0–0.2)

## 2020-08-18 LAB — COMPREHENSIVE METABOLIC PANEL
ALT: 35 U/L (ref 0–44)
AST: 58 U/L — ABNORMAL HIGH (ref 15–41)
Albumin: 3.1 g/dL — ABNORMAL LOW (ref 3.5–5.0)
Alkaline Phosphatase: 69 U/L (ref 38–126)
Anion gap: 14 (ref 5–15)
BUN: 11 mg/dL (ref 8–23)
CO2: 23 mmol/L (ref 22–32)
Calcium: 8.7 mg/dL — ABNORMAL LOW (ref 8.9–10.3)
Chloride: 92 mmol/L — ABNORMAL LOW (ref 98–111)
Creatinine, Ser: 0.92 mg/dL (ref 0.44–1.00)
GFR, Estimated: >60 mL/min (ref >60)
Glucose, Bld: 113 mg/dL — ABNORMAL HIGH (ref 70–99)
Potassium: 3.6 mmol/L (ref 3.5–5.1)
Sodium: 129 mmol/L — ABNORMAL LOW (ref 135–145)
Total Bilirubin: 1 mg/dL (ref 0.3–1.2)
Total Protein: 5.7 g/dL — ABNORMAL LOW (ref 6.5–8.1)

## 2020-08-18 LAB — LIPID PANEL
Cholesterol: 220 mg/dL — ABNORMAL HIGH (ref 0–200)
HDL: 63 mg/dL (ref >40)
LDL Cholesterol: 143 mg/dL — ABNORMAL HIGH (ref 0–99)
Total CHOL/HDL Ratio: 3.5 RATIO
Triglycerides: 69 mg/dL (ref <150)
VLDL: 14 mg/dL (ref 0–40)

## 2020-08-18 MED ORDER — MAGNESIUM SULFATE 2 GM/50ML IV SOLN
2.0000 g | Freq: Once | INTRAVENOUS | Status: AC
  Administered 2020-08-18: 2 g via INTRAVENOUS
  Filled 2020-08-18: qty 50

## 2020-08-18 MED ORDER — CYANOCOBALAMIN 1000 MCG/ML IJ SOLN
1000.0000 ug | Freq: Every day | INTRAMUSCULAR | Status: AC
  Administered 2020-08-18 – 2020-08-20 (×3): 1000 ug via INTRAMUSCULAR
  Filled 2020-08-18 (×3): qty 1

## 2020-08-18 MED ORDER — HYDRALAZINE HCL 20 MG/ML IJ SOLN
10.0000 mg | Freq: Four times a day (QID) | INTRAMUSCULAR | Status: DC | PRN
  Administered 2020-08-18 – 2020-08-25 (×8): 10 mg via INTRAVENOUS
  Filled 2020-08-18 (×9): qty 1

## 2020-08-18 NOTE — Evaluation (Signed)
Clinical/Bedside Swallow Evaluation Patient Details  Name: Jodi Barr MRN: [id number] Date of Birth: 1932-11-15  Today's Date: 08/18/2020 Time: SLP Start Time (ACUTE ONLY): 1000 SLP Stop Time (ACUTE ONLY): 1016 SLP Time Calculation (min) (ACUTE ONLY): 16 min  Past Medical History:  Past Medical History:  Diagnosis Date   Allergies    Arthritis    Hypertension    Past Surgical History: No past surgical history on file. HPI:  Pt is an 87 y.o. female after being found responsive and having a witnessed (by EMS) seizure with left sided weakness and AMS. CT head negative for acute changes.  CXR 6/9: Small left effusion with underlying opacity, likely atelectasis. PMH: HTN, HLD, GERD, chronic T12 compression fracture, constipation.   Assessment / Plan / Recommendation Clinical Impression  Limited evaluation due to AMS. Upon entrance to room, patient moving arms and legs, intermittent moaning "oh Lord", unable to follow commands, eyes remained closed. SLP repositioned patient with help of PT and patient became calm however eyes remained closed and patient still not able to follow commands or respond appropriately to cinician. Oral care provided in preparation for po trials. Patient with consistent pursing of lips in response to attempted oral care, did elicit a relatively consistent dry swallw however. Suspect that swallowing will be functional once mentation improves. Will continue to f/u for diagnostic po trials and cog-linguistic evaluation if appropriate once patient able to participate. SLP Visit Diagnosis: Dysphagia, unspecified (R13.10)    Aspiration Risk       Diet Recommendation NPO   Medication Administration: Via alternative means    Other  Recommendations Oral Care Recommendations: Oral care QID   Follow up Recommendations Other (comment) (TBD)      Frequency and Duration min 2x/week  2 weeks       Prognosis Prognosis for Safe Diet Advancement: Good Barriers to  Reach Goals: Cognitive deficits      Swallow Study   General HPI: Pt is an 87 y.o. female after being found responsive and having a witnessed (by EMS) seizure with left sided weakness and AMS. CT head negative for acute changes.  CXR 6/9: Small left effusion with underlying opacity, likely atelectasis. PMH: HTN, HLD, GERD, chronic T12 compression fracture, constipation. Type of Study: Bedside Swallow Evaluation Diet Prior to this Study: NPO Temperature Spikes Noted: No Respiratory Status: Room air History of Recent Intubation: No Behavior/Cognition: Lethargic/Drowsy;Doesn't follow directions Oral Cavity Assessment: Other (comment) (unable to fully view due to AMS) Oral Care Completed by SLP: Yes Oral Cavity - Dentition: Adequate natural dentition Self-Feeding Abilities: Total assist Patient Positioning: Upright in bed Baseline Vocal Quality: Normal Volitional Cough: Cognitively unable to elicit Volitional Swallow: Able to elicit    Oral/Motor/Sensory Function Overall Oral Motor/Sensory Function: Other (comment) (unable to assess due to AMS, appears symmetric)   Ice Chips Ice chips: Not tested   Thin Liquid Thin Liquid: Not tested    Nectar Thick Nectar Thick Liquid: Not tested   Honey Thick Honey Thick Liquid: Not tested   Puree Puree: Not tested   Solid     Solid: Not tested     Ferdinand Lango MA, CCC-SLP  Antionette Luster Meryl 08/18/2020,10:24 AM

## 2020-08-18 NOTE — Progress Notes (Signed)
Neurology Progress Note  S: Patient remains somnolent but nonfocal. Continuous EEG shows diffuse slowing with intermittent GPDs with triphasic morpho.logy up to 1 Hz  O: Current vital signs: BP (!) 158/103 (BP Location: Right Arm)   Pulse 100   Temp 98.9 F (37.2 C) (Oral)   Resp 16   Wt 53 kg   SpO2 100%   BMI 20.70 kg/m  Vital signs in last 24 hours: Temp:  [98.8 F (37.1 C)-99.7 F (37.6 C)] 98.9 F (37.2 C) (06/11 0741) Pulse Rate:  [60-133] 100 (06/11 0741) Resp:  [16-23] 16 (06/11 0741) BP: (148-205)/(68-140) 158/103 (06/11 0741) SpO2:  [83 %-100 %] 100 % (06/11 0741)  GENERAL: Frail appearing elderly female in NAD.  HEENT: Normocephalic and atraumatic LUNGS: Normal respiratory effort.  CV: RRR. Skin: pallor.  Ext: warm.  NEURO:  Mental Status: somnolent. Arousable to noxious stimuli, will not open eyes Speech/Language: moans in response to noxious stimuli only CN: PERRL, (+) corneals, resists eye opening, face symmetric at rest Motor and sensory: withdraws in all extremities to noxious stimuli briskly but not to command   Medications  Current Facility-Administered Medications:    0.9 %  sodium chloride infusion, , Intravenous, Continuous, Orland Mustard, MD, Last Rate: 75 mL/hr at 08/18/20 0738, Infusion Verify at 08/18/20 0738   acetaminophen (TYLENOL) tablet 650 mg, 650 mg, Oral, Q4H PRN **OR** acetaminophen (TYLENOL) 160 MG/5ML solution 650 mg, 650 mg, Per Tube, Q4H PRN **OR** acetaminophen (TYLENOL) suppository 650 mg, 650 mg, Rectal, Q4H PRN, Orland Mustard, MD   enoxaparin (LOVENOX) injection 40 mg, 40 mg, Subcutaneous, Q24H, Orland Mustard, MD, 40 mg at 08/17/20 2203   levETIRAcetam (KEPPRA) IVPB 500 mg/100 mL premix, 500 mg, Intravenous, Q12H, Orland Mustard, MD, Last Rate: 400 mL/hr at 08/18/20 0953, 500 mg at 08/18/20 0953   magnesium sulfate IVPB 2 g 50 mL, 2 g, Intravenous, Once, Pokhrel, Laxman, MD, Last Rate: 50 mL/hr at 08/18/20 1026, 2 g at  08/18/20 1026  Pertinent Labs Na up to 129. LDL 143. TSH  0.355.                                 rEEG 6/10/22T his study is suggestive of moderate diffuse encephalopathy, nonspecific etiology. No seizures or epileptiform discharges were seen throughout the recording.  cEEG 08/18/20 This study is suggestive of moderate diffuse encephalopathy, nonspecific etiology but could be secondary to toxic-metabolic causes. Of note, generalized periodic discharges with triphasic morphology can be on the ictal-interictal continuum. However, the frequency and morphology of the discharges as well as reactivity to stimulation in this patient is more likely indicative of toxic-metabolic etiology. No seizures were seen throughout the recording.     Assessment: 87 yo female who presented as a code stroke 08/17/20 with witnessed. GTC seizure en route. EEG thus far has not shown any seizure activity, but is + for moderate diffuse encephalopathy. Her LA was high and she had leukocytosis w/ WBC 19 on arrival but this is likely due to seizure as no other infection has been found. I personally reviewed her EEG data and discussed it with our epileptologist Dr. Melynda Ripple. The GPDs appear triphasic which favors metabolic encephalopathy.  Hyponatremia on presentation (Na+ 121) may certainly have reduced seizure threshold. Na+ 129 now after IVF. Suspect prolonged post-ictal given no e/o infection or persistent metabolic derangement identified so far on labs. MRI brain would be helpful but she would likely not lay  still while so altered. While obtain head CT noncon for interim evaluation r/o evolving acute intracranial process and continue on LTM overnight.  - head CT wo contrast now - MRI brain wwo when able - Continue mgmt of hyponatremia and medical issues per primary team - Continue LEV 500mg  bid - Continue cEEG   Signed by , MSN, APN-BC/NP Pager: Jimmye Norman   Neurology Attending Attestation   I  examined the patient and discussed plan with NP Ms. [phone]. Above note has been edited by me to reflect my findings and recommendations.    Tereso Newcomer, MD [facility] [phone]   If 7pm- 7am, please page neurology on call as listed in [facility].

## 2020-08-18 NOTE — Plan of Care (Signed)
  Problem: Education: Goal: Knowledge of General Education information will improve Description: Including pain rating scale, medication(s)/side effects and non-pharmacologic comfort measures Outcome: Progressing   Problem: Health Behavior/Discharge Planning: Goal: Ability to manage health-related needs will improve Outcome: Progressing   Problem: Clinical Measurements: Goal: Ability to maintain clinical measurements within normal limits will improve Outcome: Progressing Goal: Will remain free from infection Outcome: Progressing Goal: Diagnostic test results will improve Outcome: Progressing Goal: Respiratory complications will improve Outcome: Progressing Goal: Cardiovascular complication will be avoided Outcome: Progressing   Problem: Activity: Goal: Risk for activity intolerance will decrease Outcome: Progressing   Problem: Nutrition: Goal: Adequate nutrition will be maintained Outcome: Progressing   Problem: Coping: Goal: Level of anxiety will decrease Outcome: Progressing   Problem: Elimination: Goal: Will not experience complications related to bowel motility Outcome: Progressing Goal: Will not experience complications related to urinary retention Outcome: Progressing   Problem: Pain Managment: Goal: General experience of comfort will improve Outcome: Progressing   Problem: Safety: Goal: Ability to remain free from injury will improve Outcome: Progressing   Problem: Skin Integrity: Goal: Risk for impaired skin integrity will decrease Outcome: Progressing   Problem: Education: Goal: Expressions of having a comfortable level of knowledge regarding the disease process will increase Outcome: Progressing   Problem: Coping: Goal: Ability to adjust to condition or change in health will improve Outcome: Progressing Goal: Ability to identify appropriate support needs will improve Outcome: Progressing   Problem: Health Behavior/Discharge Planning: Goal:  Compliance with prescribed medication regimen will improve Outcome: Progressing   Problem: Medication: Goal: Risk for medication side effects will decrease Outcome: Progressing   Problem: Clinical Measurements: Goal: Complications related to the disease process, condition or treatment will be avoided or minimized Outcome: Progressing Goal: Diagnostic test results will improve Outcome: Progressing   Problem: Safety: Goal: Verbalization of understanding the information provided will improve Outcome: Progressing   Problem: Self-Concept: Goal: Level of anxiety will decrease Outcome: Progressing Goal: Ability to verbalize feelings about condition will improve Outcome: Progressing   Problem: Education: Goal: Knowledge of disease or condition will improve Outcome: Progressing Goal: Knowledge of secondary prevention will improve Outcome: Progressing Goal: Knowledge of patient specific risk factors addressed and post discharge goals established will improve Outcome: Progressing Goal: Individualized Educational Video(s) Outcome: Progressing   Problem: Coping: Goal: Will verbalize positive feelings about self Outcome: Progressing Goal: Will identify appropriate support needs Outcome: Progressing   Problem: Health Behavior/Discharge Planning: Goal: Ability to manage health-related needs will improve Outcome: Progressing   Problem: Self-Care: Goal: Ability to participate in self-care as condition permits will improve Outcome: Progressing Goal: Verbalization of feelings and concerns over difficulty with self-care will improve Outcome: Progressing Goal: Ability to communicate needs accurately will improve Outcome: Progressing   Problem: Nutrition: Goal: Risk of aspiration will decrease Outcome: Progressing Goal: Dietary intake will improve Outcome: Progressing   Problem: Ischemic Stroke/TIA Tissue Perfusion: Goal: Complications of ischemic stroke/TIA will be  minimized Outcome: Progressing   

## 2020-08-18 NOTE — Progress Notes (Addendum)
PROGRESS NOTE  Jodi Barr UEA:[id number] DOB: 27-Mar-1932 DOA: 08/17/2020 PCP: Geoffry Paradise, MD   LOS: 1 day   Brief narrative:  Jodi Barr is a 87 y.o. female with medical history significant of HTN, HLD, GERD, chronic T12 compression fracture, constipation presented to hospital nonresponsive.  Patient was having back pain and was on muscle relaxant as outpatient .  The patient's health care power of attorney/neighbor checked on her and was found to be unresponsive.  EMS was called in.  Patient had left-sided weakness and witnessed seizures in the ambulance..  She did have tongue and was incontinent.  In the ED patient was given 2 g of Keppra IV.  She was nonverbal.  Code stroke was called in as well.  Neurology was consulted and EEG was performed.  CT spine was normal.  CT angiogram of the neck showed no plaque.  Patient was then admitted to hospital for further evaluation and treatment.  Assessment/Plan:  Principal Problem:   Seizure (HCC) Active Problems:   Hyperlipidemia   Essential hypertension   Acute left-sided weakness   Altered mental status   Leukocytosis   Hyponatremia   Enlarged thyroid   Low back pain   Seizure disorder with postictal state. Neurology on board.  CT head without any acute findings.  EEG with encephalopathy.  Loaded with Keppra.  Neurology on board.  Has a hyponatremia.  Has been on normal saline for now.  Continue seizure precautions fall precautions.  Follow neurology recommendations.      Acute left-sided weakness CT head scan was negative.  MRI has been ordered.  We will also include seizure disorder/rule out stroke.  Continue aspirin and Plavix.  PT OT when patient is more alert awake.  Speech therapy evaluation   Altered mental status/metabolic encephalopathy. Could be secondary to underlying postictal state/hyponatremia/possible stroke/medication effect.  Vitamin B12 306 folate 13.1 TSH 0.3. blood cultures negative so far.  Urinalysis  without white cells or nitrates.  Discontinue Rocephin.  Patient tested negative for COVID.    Leukocytosis Monitor.  Could be reactive as well.  Pancultured.      Hyponatremia -Baseline sodium appears to be around 128. No significant change.  Unlikely that the hyponatremia is contributing much.    Hyperlipidemia Not on medications.  Hypomagnesemia.  Will replace with magnesium sulfate 2 g today    Essential hypertension -Allowing Permissive hypertension first 24 hours goal less than 220/110      Enlarged thyroid TSH within normal limits.  Will require outpatient follow-up.    Low back pain Hold muscle relaxant. Known chronic T12 compression fracture.   DVT prophylaxis: enoxaparin (LOVENOX) injection 40 mg Start: 08/17/20 2200   Code Status: Full code  Family Communication: I spoke with the patient's health care power attorney and updated him about the patient.   Status is: Inpatient  Remains inpatient appropriate because:Ongoing diagnostic testing needed not appropriate for outpatient work up, Unsafe d/c plan, IV treatments appropriate due to intensity of illness or inability to take PO, and Inpatient level of care appropriate due to severity of illness  Dispo: The patient is from: Home              Anticipated d/c is to:  Undetermined at this time.  Normallly alert awake and independent at home.              Patient currently is not medically stable to d/c.   Difficult to place patient No   Consultants: Neurology  Procedures:  EEG  Anti-infectives:  None  Anti-infectives (From admission, onward)    Start     Dose/Rate Route Frequency Ordered Stop   08/17/20 2315  cefTRIAXone (ROCEPHIN) 2 g in sodium chloride 0.9 % 100 mL IVPB        2 g 200 mL/hr over 30 Minutes Intravenous  Once 08/17/20 2216 08/18/20 0344        Subjective: Today, patient was seen and examined at bedside. Minimally responsive on deep stimulus.  Objective: Vitals:   08/18/20 0741  08/18/20 1149  BP: (!) 158/103 (!) 175/84  Pulse: 100 88  Resp: 16 20  Temp: 98.9 F (37.2 C) 99.2 F (37.3 C)  SpO2: 100% 100%    Intake/Output Summary (Last 24 hours) at 08/18/2020 1242 Last data filed at 08/18/2020 1151 Gross per 24 hour  Intake 2169.83 ml  Output 500 ml  Net 1669.83 ml   Filed Weights   08/17/20 0900  Weight: 53 kg   Body mass index is 20.7 kg/m.   Physical Exam: GENERAL: Patient somnolent, responds on painful stimulus, closing her eyes, not in obvious distress.  Thinly built HENT: No scleral pallor or icterus. Pupils equally reactive to light. Oral mucosa is dry NECK: is supple, no gross swelling noted. CHEST: Clear to auscultation. No crackles or wheezes.  Diminished breath sounds bilaterally. CVS: S1 and S2 heard, no murmur. Regular rate and rhythm.  ABDOMEN: Soft, non-tender, bowel sounds are present. EXTREMITIES: No edema. CNS: Somnolent, responds with extremity movement on the right side on deep painful stimulus, SKIN: warm and dry without rashes.  Data Review: I have personally reviewed the following laboratory data and studies,  CBC: Recent Labs  Lab 08/17/20 0900 08/17/20 0914 08/17/20 1450 08/18/20 0148  WBC 20.0*  --  19.0* 16.2*  NEUTROABS 17.7*  --  16.6* 13.6*  HGB 13.3 13.9 13.0 14.3  HCT 39.1 41.0 37.4 40.8  MCV 93.8  --  91.4 90.9  PLT 371  --  348 323   Basic Metabolic Panel: Recent Labs  Lab 08/17/20 0900 08/17/20 0914 08/17/20 1450 08/18/20 0148  NA 121* 121* 128* 129*  K 3.9 3.9 3.0* 3.6  CL 88* 88* 94* 92*  CO2 19*  --  23 23  GLUCOSE 178* 176* 140* 113*  BUN 14 14 11 11   CREATININE 1.08* 0.90 0.84 0.92  CALCIUM 9.1  --  8.2* 8.7*  MG  --   --  1.4*  --    Liver Function Tests: Recent Labs  Lab 08/17/20 0900 08/18/20 0148  AST 44* 58*  ALT 32 35  ALKPHOS 77 69  BILITOT 0.9 1.0  PROT 6.4* 5.7*  ALBUMIN 3.6 3.1*   No results for input(s): LIPASE, AMYLASE in the last 168 hours. No results for  input(s): AMMONIA in the last 168 hours. Cardiac Enzymes: Recent Labs  Lab 08/17/20 1450  CKTOTAL 964*   BNP (last 3 results) Recent Labs    08/17/20 1450  BNP 653.8*    ProBNP (last 3 results) No results for input(s): PROBNP in the last 8760 hours.  CBG: Recent Labs  Lab 08/17/20 0858  GLUCAP 193*   Recent Results (from the past 240 hour(s))  Resp Panel by RT-PCR (Flu A&B, Covid) Nasopharyngeal Swab     Status: None   Collection Time: 08/17/20 10:22 AM   Specimen: Nasopharyngeal Swab; Nasopharyngeal(NP) swabs in vial transport medium  Result Value Ref Range Status   SARS Coronavirus 2 by RT PCR NEGATIVE NEGATIVE Final  Comment: (NOTE) SARS-CoV-2 target nucleic acids are NOT DETECTED.  The SARS-CoV-2 RNA is generally detectable in upper respiratory specimens during the acute phase of infection. The lowest concentration of SARS-CoV-2 viral copies this assay can detect is 138 copies/mL. A negative result does not preclude SARS-Cov-2 infection and should not be used as the sole basis for treatment or other patient management decisions. A negative result may occur with  improper specimen collection/handling, submission of specimen other than nasopharyngeal swab, presence of viral mutation(s) within the areas targeted by this assay, and inadequate number of viral copies(<138 copies/mL). A negative result must be combined with clinical observations, patient history, and epidemiological information. The expected result is Negative.  Fact Sheet for Patients:  BloggerCourse.com  Fact Sheet for Healthcare Providers:  SeriousBroker.it  This test is no t yet approved or cleared by the [place] FDA and  has been authorized for detection and/or diagnosis of SARS-CoV-2 by FDA under an Emergency Use Authorization (EUA). This EUA will remain  in effect (meaning this test can be used) for the duration of the COVID-19  declaration under Section 564(b)(1) of the Act, 21 U.S.C.section 360bbb-3(b)(1), unless the authorization is terminated  or revoked sooner.       Influenza A by PCR NEGATIVE NEGATIVE Final   Influenza B by PCR NEGATIVE NEGATIVE Final    Comment: (NOTE) The Xpert Xpress SARS-CoV-2/FLU/RSV plus assay is intended as an aid in the diagnosis of influenza from Nasopharyngeal swab specimens and should not be used as a sole basis for treatment. Nasal washings and aspirates are unacceptable for Xpert Xpress SARS-CoV-2/FLU/RSV testing.  Fact Sheet for Patients: BloggerCourse.com  Fact Sheet for Healthcare Providers: SeriousBroker.it  This test is not yet approved or cleared by the [place] FDA and has been authorized for detection and/or diagnosis of SARS-CoV-2 by FDA under an Emergency Use Authorization (EUA). This EUA will remain in effect (meaning this test can be used) for the duration of the COVID-19 declaration under Section 564(b)(1) of the Act, 21 U.S.C. section 360bbb-3(b)(1), unless the authorization is terminated or revoked.  Performed at [facility], [address] [place], [place] [address]   Culture, blood (routine x 2)     Status: None (Preliminary result)   Collection Time: 08/17/20  1:20 PM   Specimen: BLOOD  Result Value Ref Range Status   Specimen Description BLOOD RIGHT ANTECUBITAL  Final   Special Requests   Final    BOTTLES DRAWN AEROBIC AND ANAEROBIC Blood Culture results may not be optimal due to an inadequate volume of blood received in culture bottles   Culture   Final    NO GROWTH < 24 HOURS Performed at [facility], [address], [place], [place] [address]    Report Status PENDING  Incomplete  Culture, blood (routine x 2)     Status: None (Preliminary result)   Collection Time: 08/17/20  2:50 PM   Specimen: BLOOD  Result Value Ref Range Status   Specimen Description BLOOD RIGHT  ANTECUBITAL  Final   Special Requests   Final    BOTTLES DRAWN AEROBIC AND ANAEROBIC Blood Culture results may not be optimal due to an inadequate volume of blood received in culture bottles   Culture   Final    NO GROWTH < 24 HOURS Performed at [facility], [address] [place], [place] [address]    Report Status PENDING  Incomplete     Studies: CT C-SPINE NO CHARGE  Result Date: 08/17/2020 CLINICAL DATA:  Neck pain EXAM: CT CERVICAL SPINE WITHOUT CONTRAST TECHNIQUE: Multidetector CT imaging of the cervical spine was performed without intravenous contrast. Multiplanar CT image reconstructions were also generated. COMPARISON:  Vascular studies earlier today. FINDINGS: Alignment: Exaggerated cervical lordosis. Skull base and vertebrae: No fracture or primary/focal bone lesion. Soft tissues and spinal canal: Spinal canal is large in widely patent. Disc levels: No significant degenerative disc disease. Very minimal endplate osteophytes at C5-6 but no bony encroachment upon the canal or foramina. Remarkable absence of facet osteoarthritis. Very minimal degenerative change of the left C2-3 facet. All of these findings are much less notable than usually seen at this age. The canal and foramina are widely patent. Upper chest: Bilateral effusions layering dependently with dependent atelectasis. Other: None IMPRESSION: Remarkably normal appearing cervical spine for a person of this age. Somewhat exaggerated lordosis. No significant degenerative disc disease or degenerative facet disease. No stenosis of the canal or foramina. Electronically Signed   By: Paulina Fusi M.D.   On: 08/17/2020 10:41   DG CHEST PORT 1 VIEW  Result Date: 08/17/2020 CLINICAL DATA:  Seizure. EXAM: PORTABLE CHEST 1 VIEW COMPARISON:  None. FINDINGS: There is a small left effusion with underlying opacity, likely atelectasis. Probable mild cardiomegaly. The hila and mediastinum are unremarkable. No pneumothorax. No nodules or  masses. No overt edema. Mild pulmonary venous congestion not excluded. IMPRESSION: Small left effusion with underlying opacity, likely atelectasis. Mild pulmonary venous congestion is not excluded but there is no overt edema. Electronically Signed   By: Gerome Sam III M.D   On: 08/17/2020 13:51   EEG adult  Result Date: 08/17/2020 Charlsie Quest, MD     08/17/2020 11:58 AM Patient Name: ESTREYA CLAY MRN: [id number] Epilepsy Attending: Charlsie Quest Referring Physician/Provider: Jimmye Norman, NP Date: 08/17/2020 Duration: 22.51 mins Patient history: 87 year old female with seizure-like activity and left-sided weakness.  EEG generally for seizures. Level of alertness: Awake AEDs during EEG study: Keppra Technical aspects: This EEG study was done with scalp electrodes positioned according to the 10-20 International system of electrode placement. Electrical activity was acquired at a sampling rate of  and reviewed with a high frequency filter of  and a low frequency filter of . EEG data were recorded continuously and digitally stored. Description: EEG showed continuous generalized 5 to 7 Hz theta as well as intermittent generalized 2 to 3 Hz delta slowing. Hyperventilation and photic stimulation were not performed.   ABNORMALITY - Continuous slow, generalized IMPRESSION: This study is suggestive of moderate diffuse encephalopathy, nonspecific etiology. No seizures or epileptiform discharges were seen throughout the recording. Priyanka Annabelle Harman   Overnight EEG with video  Result Date: 08/18/2020 Charlsie Quest, MD     08/18/2020  7:58 AM Patient Name: ROCKLYN MAYBERRY MRN: [id number] Epilepsy Attending: Charlsie Quest Referring Physician/Provider: Jimmye Norman, NP Duration: 08/17/2020 1134 to 08/18/2020 0745  Patient history: 87 year old female with seizure-like activity and left-sided weakness.  EEG generally for seizures.  Level of alertness: Awake  AEDs during EEG study:  Keppra  Technical aspects: This EEG study was done with scalp electrodes positioned according to the 10-20 International system of electrode placement. Electrical activity was acquired at a sampling rate of  and reviewed with a high frequency filter of  and a low frequency filter of . EEG data were recorded continuously and digitally stored.  Description: EEG showed continuous generalized 5 to 7 Hz theta as well as intermittent generalized 2  to 3 Hz delta slowing. Generalized periodic discharges with triphasic morphology at 1Hz  were also noted intermittently, moe commonly when awake/stimulated. Hyperventilation and photic stimulation were not performed.    ABNORMALITY - Continuous slow, generalized - Periodic discharges with triphasic morphology, generalized ( GPDs)  IMPRESSION: This study is suggestive of moderate diffuse encephalopathy, nonspecific etiology but could be secondary to toxic-metabolic causes. Of note, generalized periodic discharges with triphasic morphology can be on the ictal-interictal continuum. However, the frequency and morphology of the discharges as well as reactivity to stimulation in this patient is more likely indicative of toxic-metabolic etiology. No seizures were seen throughout the recording.  Charlsie Questriyanka O Yadav   ECHOCARDIOGRAM COMPLETE  Result Date: 08/17/2020    ECHOCARDIOGRAM REPORT   Patient Name:   Alda LeaJEANENE R Pattison Date of Exam: 08/17/2020 Medical Rec #:  [id number]        Height:       63.0 in Accession #:    [id number]       Weight:       116.8 lb Date of Birth:  12/21/1932        BSA:          1.539 m Patient Age:    88 years         BP:           191/96 mmHg Patient Gender: F                HR:           108 bpm. Exam Location:  Inpatient Procedure: 2D Echo, Cardiac Doppler and Color Doppler Indications:    Stroke  History:        Patient has no prior history of Echocardiogram examinations.                 Risk Factors:Hypertension.  Sonographer:    Shirlean KellyJohn Mendel  Brown Referring Phys: [id number] Leda GauzeKAREN J KIRBY-GRAHAM  Sonographer Comments: Image acquisition challenging due to uncooperative patient. IMPRESSIONS  1. Left ventricular ejection fraction, by estimation, is 60 to 65%. The left ventricle has normal function. The left ventricle has no regional wall motion abnormalities. There is mild left ventricular hypertrophy of the basal-septal segment. Left ventricular diastolic function could not be evaluated.  2. Right ventricular systolic function is normal. The right ventricular size is normal.  3. The mitral valve is normal in structure. Mild mitral valve regurgitation. No evidence of mitral stenosis.  4. The aortic valve is tricuspid. There is moderate calcification of the aortic valve. There is moderate thickening of the aortic valve. Aortic valve regurgitation is not visualized. Mild to moderate aortic valve sclerosis/calcification is present, without any evidence of aortic stenosis.  5. The inferior vena cava is normal in size with greater than 50% respiratory variability, suggesting right atrial pressure of 3 mmHg. FINDINGS  Left Ventricle: Left ventricular ejection fraction, by estimation, is 60 to 65%. The left ventricle has normal function. The left ventricle has no regional wall motion abnormalities. The left ventricular internal cavity size was normal in size. There is  mild left ventricular hypertrophy of the basal-septal segment. Left ventricular diastolic function could not be evaluated due to atrial fibrillation. Left ventricular diastolic function could not be evaluated. Right Ventricle: The right ventricular size is normal. No increase in right ventricular wall thickness. Right ventricular systolic function is normal. Left Atrium: Left atrial size was normal in size. Right Atrium: Right atrial size was normal in size. Pericardium: There is no evidence of  pericardial effusion. Mitral Valve: The mitral valve is normal in structure. Mild mitral annular calcification.  Mild mitral valve regurgitation. No evidence of mitral valve stenosis. Tricuspid Valve: The tricuspid valve is normal in structure. Tricuspid valve regurgitation is mild . No evidence of tricuspid stenosis. Aortic Valve: The aortic valve is tricuspid. There is moderate calcification of the aortic valve. There is moderate thickening of the aortic valve. Aortic valve regurgitation is not visualized. Mild to moderate aortic valve sclerosis/calcification is present, without any evidence of aortic stenosis. Aortic valve mean gradient measures 5.5 mmHg. Aortic valve peak gradient measures 9.5 mmHg. Aortic valve area, by VTI measures 2.16 cm. Pulmonic Valve: The pulmonic valve was normal in structure. Pulmonic valve regurgitation is not visualized. No evidence of pulmonic stenosis. Aorta: The aortic root is normal in size and structure. Venous: The inferior vena cava is normal in size with greater than 50% respiratory variability, suggesting right atrial pressure of 3 mmHg. IAS/Shunts: No atrial level shunt detected by color flow Doppler.  LEFT VENTRICLE PLAX 2D LVIDd:         3.10 cm LVIDs:         2.30 cm LV PW:         0.80 cm LV IVS:        1.20 cm LVOT diam:     2.00 cm LV SV:         60 LV SV Index:   39 LVOT Area:     3.14 cm  RIGHT VENTRICLE             IVC RV S prime:     14.10 cm/s  IVC diam: 1.20 cm LEFT ATRIUM             Index       RIGHT ATRIUM          Index LA diam:        2.70 cm 1.75 cm/m  RA Area:     9.25 cm LA Vol (A2C):   37.4 ml 24.31 ml/m RA Volume:   16.70 ml 10.85 ml/m LA Vol (A4C):   41.0 ml 26.64 ml/m LA Biplane Vol: 39.2 ml 25.48 ml/m  AORTIC VALVE AV Area (Vmax):    2.40 cm AV Area (Vmean):   2.30 cm AV Area (VTI):     2.16 cm AV Vmax:           154.50 cm/s AV Vmean:          110.500 cm/s AV VTI:            0.279 m AV Peak Grad:      9.5 mmHg AV Mean Grad:      5.5 mmHg LVOT Vmax:         117.93 cm/s LVOT Vmean:        80.967 cm/s LVOT VTI:          0.192 m LVOT/AV VTI ratio: 0.69   AORTA Ao Root diam: 2.60 cm Ao Asc diam:  2.60 cm TRICUSPID VALVE TR Peak grad:   30.0 mmHg TR Vmax:        274.00 cm/s  SHUNTS Systemic VTI:  0.19 m Systemic Diam: 2.00 cm Armanda Magic MD Electronically signed by Armanda Magic MD Signature Date/Time: 08/17/2020/3:53:30 PM    Final    US THYROID  Result Date: 08/17/2020 CLINICAL DATA:  Thyromegaly EXAM: THYROID ULTRASOUND TECHNIQUE: Ultrasound examination of the thyroid gland and adjacent soft tissues was performed. COMPARISON:  CT angiography head and  neck 08/17/2020 FINDINGS: Parenchymal Echotexture: Moderately heterogeneous Isthmus: 0.6 cm Right lobe: 5.2 x 2.8 x 3.4 cm Left lobe: 5.8 x 2.6 x 3.5 cm _________________________________________________________ Estimated total number of nodules >/= 1 cm: 2 Number of spongiform nodules >/=  2 cm not described below (TR1): 0 Number of mixed cystic and solid nodules >/= 1.5 cm not described below (TR2): 0 _________________________________________________________ The right thyroid lobe is diffusely enlarged and goiterous. No discrete discernible nodule is identified. 1.0 x 0.8 x 0.7 cm spongiform nodule in the left thyroid lobe does not meet criteria for FNA or surveillance. IMPRESSION: Diffusely enlarged heterogeneous thyroid without discrete nodule which meets criteria for FNA or surveillance. The above is in keeping with the ACR TI-RADS recommendations - J Am Coll Radiol 2017;14:587-595. Electronically Signed   By: Acquanetta Belling M.D.   On: 08/17/2020 16:56   CT HEAD CODE STROKE WO CONTRAST  Result Date: 08/17/2020 CLINICAL DATA:  Code stroke. Sudden altered mental status and left-sided weakness. EXAM: CT HEAD WITHOUT CONTRAST TECHNIQUE: Contiguous axial images were obtained from the base of the skull through the vertex without intravenous contrast. COMPARISON:  None. FINDINGS: Brain: Age related atrophy. No focal abnormality seen affecting the brainstem or cerebellum. Cerebral hemispheres show generalized atrophy  with chronic small-vessel ischemic changes of the white matter. No evidence of cortical or large vessel territory infarction. Vascular: There is atherosclerotic calcification of the major vessels at the base of the brain. No definite acute hyperdense vessel. Skull: Normal Sinuses/Orbits: Clear/normal Other: None ASPECTS (Alberta Stroke Program Early CT Score) - Ganglionic level infarction (caudate, lentiform nuclei, internal capsule, insula, M1-M3 cortex): 7 - Supraganglionic infarction (M4-M6 cortex): 3 Total score (0-10 with 10 being normal): 10 IMPRESSION: 1. No acute finding. Atrophy and chronic small-vessel ischemic changes. Atherosclerotic calcification of the major vessels at the base of the brain. 2. ASPECTS is 10 3. These results were communicated to Dr. Selina Cooley at 9:07 amon 6/10/2022by text page via the [facility] messaging system. Electronically Signed   By: Paulina Fusi M.D.   On: 08/17/2020 09:08   CT ANGIO HEAD NECK W WO CM W PERF (CODE STROKE)  Result Date: 08/17/2020 CLINICAL DATA:  Code stroke presentation. Altered mental status and left-sided weakness by previous history. EXAM: CT ANGIOGRAPHY HEAD AND NECK CT PERFUSION BRAIN TECHNIQUE: Multidetector CT imaging of the head and neck was performed using the standard protocol during bolus administration of intravenous contrast. Multiplanar CT image reconstructions and MIPs were obtained to evaluate the vascular anatomy. Carotid stenosis measurements (when applicable) are obtained utilizing NASCET criteria, using the distal internal carotid diameter as the denominator. Multiphase CT imaging of the brain was performed following IV bolus contrast injection. Subsequent parametric perfusion maps were calculated using RAPID software. CONTRAST:  OMNIPAQUE IOHEXOL 350 MG/ML SOLN COMPARISON:  Head CT earlier same day. FINDINGS: CTA NECK FINDINGS Aortic arch: Aortic atherosclerosis. Brachiocephalic vessel origins not included in the region studied. Right  carotid system: Proximal innominate artery not included. Right common carotid artery widely patent to the bifurcation. No soft or calcified plaque at the carotid bifurcation or ICA bulb. Cervical ICA is tortuous but widely patent. Left carotid system: Left vertebral artery origin from the arch not included. Beginning at visualization, the left common carotid artery is widely patent to the bifurcation. No soft or calcified plaque at the carotid bifurcation. Cervical ICA is tortuous but widely patent. Vertebral arteries: Both vertebral artery origins are widely patent. Both vertebral arteries appear widely patent and normal through the  cervical region to the foramen magnum. The right vertebral artery is dominant Skeleton: Exaggerated cervical lordosis. No significant degenerative changes. Other neck: No lymphadenopathy. Enlarged heterogeneous thyroid gland most consistent with goiter. Upper chest: Bilateral pleural effusions layering dependently with dependent atelectasis. Review of the MIP images confirms the above findings CTA HEAD FINDINGS Anterior circulation: Both internal carotid arteries are widely patent through the skull base and siphon regions. Minimal siphon atherosclerotic calcification but no stenosis. The anterior and middle cerebral vessels are patent. Diminutive right A1 segment, likely congenital. No large or medium vessel occlusion or correctable proximal stenosis. Fetal origin of the right PCA. Posterior circulation: Both vertebral arteries widely patent to the basilar. No basilar stenosis. Posterior circulation branch vessels appear normal. As noted above, fetal origin of the right PCA. Venous sinuses: Patent and normal. Anatomic variants: None other significant. Review of the MIP images confirms the above findings CT Brain Perfusion Findings: ASPECTS: 10 CBF (<30%) Volume: 0mL Perfusion (Tmax>6.0s) volume: 0mL Mismatch Volume: 0mL Infarction Location:None IMPRESSION: Remarkable absence of  atherosclerotic plaque. No stenosis or irregularity. No large or medium vessel occlusion. Bilateral pleural effusions layering dependently. Brachiocephalic vessel origins from the arch not included, but given the absence of atherosclerotic disease elsewhere, disease would not be expected. Enlarged heterogeneous thyroid. Recommend thyroid ultrasound (ref: J Am Coll Radiol. 2015 Feb;12(2): 143-50). Electronically Signed   By: Paulina Fusi M.D.   On: 08/17/2020 09:32      Joycelyn Das, MD  [facility] 08/18/2020  If 7PM-7AM, please contact night-coverage

## 2020-08-18 NOTE — Procedures (Addendum)
Patient Name: Jodi Barr  MRN: [id number]  Epilepsy Attending: Charlsie Quest  Referring Physician/Provider: Jimmye Norman, NP Duration: 08/17/2020 1134 to 08/18/2020 1134   Patient history: 87 year old female with seizure-like activity and left-sided weakness.  EEG generally for seizures.   Level of alertness: Awake   AEDs during EEG study: Keppra   Technical aspects: This EEG study was done with scalp electrodes positioned according to the 10-20 International system of electrode placement. Electrical activity was acquired at a sampling rate of 500Hz  and reviewed with a high frequency filter of 70Hz  and a low frequency filter of 1Hz . EEG data were recorded continuously and digitally stored.   Description: EEG showed continuous generalized 5 to 7 Hz theta as well as intermittent generalized 2 to 3 Hz delta slowing. Generalized periodic discharges with triphasic morphology at 1Hz  were also noted intermittently, moe commonly when awake/stimulated. Hyperventilation and photic stimulation were not performed.      ABNORMALITY - Continuous slow, generalized - Periodic discharges with triphasic morphology, generalized ( GPDs)   IMPRESSION: This study is suggestive of moderate diffuse encephalopathy, nonspecific etiology but could be secondary to toxic-metabolic causes. Of note, generalized periodic discharges with triphasic morphology can be on the ictal-interictal continuum. However, the frequency and morphology of the discharges as well as reactivity to stimulation in this patient is more likely indicative of toxic-metabolic etiology. No seizures were seen throughout the recording.   Matricia Begnaud 

## 2020-08-18 NOTE — Progress Notes (Signed)
New orders received for pt potassium. Pt lactic acid rechecked to be 2.2. MD Rathore notified. No new orders received. Dionne Bucy RN

## 2020-08-18 NOTE — Progress Notes (Signed)
LTM maintenance completed; no skin breakdown was seen. Re-attached EKG leads. Tested event button.

## 2020-08-18 NOTE — Progress Notes (Signed)
LTM .  [facility] monitored, Event button test confirmed by [facility].

## 2020-08-19 LAB — CBC WITH DIFFERENTIAL/PLATELET
Abs Immature Granulocytes: 0.11 10*3/uL — ABNORMAL HIGH (ref 0.00–0.07)
Basophils Absolute: 0 10*3/uL (ref 0.0–0.1)
Basophils Relative: 0 %
Eosinophils Absolute: 0 10*3/uL (ref 0.0–0.5)
Eosinophils Relative: 0 %
HCT: 36.9 % (ref 36.0–46.0)
Hemoglobin: 12.3 g/dL (ref 12.0–15.0)
Immature Granulocytes: 1 %
Lymphocytes Relative: 5 %
Lymphs Abs: 0.6 10*3/uL — ABNORMAL LOW (ref 0.7–4.0)
MCH: 31.8 pg (ref 26.0–34.0)
MCHC: 33.3 g/dL (ref 30.0–36.0)
MCV: 95.3 fL (ref 80.0–100.0)
Monocytes Absolute: 1.6 10*3/uL — ABNORMAL HIGH (ref 0.1–1.0)
Monocytes Relative: 11 %
Neutro Abs: 11.7 10*3/uL — ABNORMAL HIGH (ref 1.7–7.7)
Neutrophils Relative %: 83 %
Platelets: 284 10*3/uL (ref 150–400)
RBC: 3.87 MIL/uL (ref 3.87–5.11)
RDW: 13.2 % (ref 11.5–15.5)
WBC: 14 10*3/uL — ABNORMAL HIGH (ref 4.0–10.5)
nRBC: 0 % (ref 0.0–0.2)

## 2020-08-19 LAB — BASIC METABOLIC PANEL
Anion gap: 14 (ref 5–15)
BUN: 23 mg/dL (ref 8–23)
CO2: 15 mmol/L — ABNORMAL LOW (ref 22–32)
Calcium: 8.5 mg/dL — ABNORMAL LOW (ref 8.9–10.3)
Chloride: 102 mmol/L (ref 98–111)
Creatinine, Ser: 0.98 mg/dL (ref 0.44–1.00)
GFR, Estimated: 56 mL/min — ABNORMAL LOW (ref >60)
Glucose, Bld: 99 mg/dL (ref 70–99)
Potassium: 3.7 mmol/L (ref 3.5–5.1)
Sodium: 131 mmol/L — ABNORMAL LOW (ref 135–145)

## 2020-08-19 LAB — LACTIC ACID, PLASMA: Lactic Acid, Venous: 1.9 mmol/L (ref 0.5–1.9)

## 2020-08-19 MED ORDER — ATORVASTATIN CALCIUM 40 MG PO TABS: 40.0000 mg | ORAL_TABLET | Freq: Every day | ORAL | Status: DC

## 2020-08-19 MED ORDER — CHLORHEXIDINE GLUCONATE CLOTH 2 % EX PADS
6.0000 | MEDICATED_PAD | Freq: Every day | CUTANEOUS | Status: DC
  Administered 2020-08-19 – 2020-08-29 (×10): 6 via TOPICAL

## 2020-08-19 MED ORDER — ATORVASTATIN CALCIUM 40 MG PO TABS
40.0000 mg | ORAL_TABLET | Freq: Every day | ORAL | Status: DC
  Administered 2020-08-21 – 2020-08-23 (×3): 40 mg
  Filled 2020-08-19 (×3): qty 1

## 2020-08-19 NOTE — Progress Notes (Signed)
Day shift staff reported decreased urine output for pt. Pt was bladder scan for greater than 500 in bladder. MD on-call Rathore notified and new orders received for bladder scan and I&O cath. Pt witnessed to have urine incontinence on pad but still I & O cath for 1500 ml. Pt assessed to be resting comfortably in bed post cath. Call light within reach and bed alarm on. Will continue to closely monitor. Dionne Bucy RN

## 2020-08-19 NOTE — Progress Notes (Addendum)
Neurology Progress Note  S: Per RN that has her today and had her yesterday, patient is more interactive and talking. No further seizures have been seen. Per RN, patient converted to AF this a.m. and attending is aware. Patient denies n/v or HA. States her legs hurt.   O: Current vital signs: BP (!) 166/130 (BP Location: Right Arm)   Pulse 86   Temp 98.6 F (37 C)   Resp 20   Wt 53 kg   SpO2 96%   BMI 20.70 kg/m  Vital signs in last 24 hours: Temp:  [97.8 F (36.6 C)-99.4 F (37.4 C)] 98.6 F (37 C) (06/12 0800) Pulse Rate:  [74-105] 86 (06/12 0800) Resp:  [17-20] 20 (06/12 0800) BP: (147-185)/(67-130) 166/130 (06/12 0800) SpO2:  [95 %-100 %] 96 % (06/12 0800)  GENERAL: Awake, keeps her eyes closed most of time, but opens on command.  HEENT: Normocephalic and atraumatic LUNGS: Normal respiratory effort.  CV: AF on tele.  ABDOMEN: Soft, nontender Ext: warm   NEURO:  Mental Status: Awake. Keeps eyes closed most of time, but opens on command. Oriented to self, hospital. Follows simple commands today.  Speech/Language: speech is without aphasia or dysarthria. Comprehension intact.  Cranial Nerves:  II: PERRL.  III, IV, VI: EOMI. Eyelids elevate symmetrically.  V: Sensation is intact to light touch and symmetrical to face.  VII: Smile is symmetrical.  VIII: hearing intact to voice. IX, X: Palate elevates symmetrically. Phonation is normal.  XII: tongue is midline without fasciculations. Motor: Moves all four extremities purposefully. Will raise right arm and hold it without drift. Will not raise her left arm on command. Moves legs around in bed. Grips are equal with some effort against gravity.  Tone: is normal and bulk is decreased.  Sensation- Intact to light touch bilaterally.  Coordination/cerebrum-no signs of the left shoulder and neck twitching that was present yesterday.  Gait- deferred  Medications  Current Facility-Administered Medications:    0.9 %  sodium  chloride infusion, , Intravenous, Continuous, Orland Mustard, MD, Last Rate: 75 mL/hr at 08/19/20 1030, New Bag at 08/19/20 1030   acetaminophen (TYLENOL) tablet 650 mg, 650 mg, Oral, Q4H PRN **OR** acetaminophen (TYLENOL) 160 MG/5ML solution 650 mg, 650 mg, Per Tube, Q4H PRN **OR** acetaminophen (TYLENOL) suppository 650 mg, 650 mg, Rectal, Q4H PRN, Orland Mustard, MD   Chlorhexidine Gluconate Cloth 2 % PADS 6 each, 6 each, Topical, Daily, Kathlen Mody, MD   cyanocobalamin ((VITAMIN B-12)) injection 1,000 mcg, 1,000 mcg, Intramuscular, Daily, Kirby-Graham, Beather Arbour, NP, 1,000 mcg at 08/19/20 1019   enoxaparin (LOVENOX) injection 40 mg, 40 mg, Subcutaneous, Q24H, Orland Mustard, MD, 40 mg at 08/18/20 2109   hydrALAZINE (APRESOLINE) injection 10 mg, 10 mg, Intravenous, Q6H PRN, Pokhrel, Laxman, MD, 10 mg at 08/18/20 1802   levETIRAcetam (KEPPRA) IVPB 500 mg/100 mL premix, 500 mg, Intravenous, Q12H, Orland Mustard, MD, Last Rate: 400 mL/hr at 08/19/20 1018, 500 mg at 08/19/20 1018  Imaging: [facility] 08/18/20 without acute abnormality.   MRI Brain ordered and will get when patient can be cooperative.   EEG: This study is suggestive of moderate diffuse encephalopathy, nonspecific etiology  No seizures or definite epileptiform discharges were seen throughout the recording. EEG appears to be improving compared to previous day.    Assessment: 87 yo female who presented as a code stroke 08/17/20 with witnessed GTC seizure en route. 1st EEG did show GPDs with triphasic morphology up to 1 Hz but no seizures or epileptiform discharges, but  she had been given a 2gm load of Keppra before study. Overnight EEG shows improvement over 08/18/20. without seizure activity but continues to display moderately diffuse encephalopathy. Workup so far, is without infectious reasons that may provoke a seizure, but she did have metabolic derangements which are being treated. Her hyponatremia has been corrected to 128.  From a mental  status standpoint, she improved markedly today, and suspect prolonged post-ictal state. Etiology of seizure not yet identified. She would not participate w/ formal strength exam today but she seems to be moving LUE less spontaneously. EMS also reported L sided weakness en route prior to seizure. She went into a fib today (new dx) and she may have had a R sided infarct leading to the seizure. Plan for further imaging tmrw  Impression:  -Seizure, so far no evidence of provocation except possibly hyponatrermia.  -Possible stroke without evidence on imaging thus far, but still waiting on MRI brain with and without.  -HLD.  Plan: -Continue to correct metabolic derangement as you are doing.  New onset a fib - mgmt per primary team. I would not anticoagulate for this until repeat brain imaging tmrw does not show evolving acute infarct - NPO until formal SLP eval.  - ASA not given 2/2 pt allergic. Unable to take plavix 2/2 NPO -MRI brain with and without when able. If she is unable to tolerate 2/2 chronic back pain, repeat head CT noncon -seizure precautions.  -Continue Keppra 500mg  IV/po q12 hours.  -Lipitor 40mg  po qd when able to take po  Will continue to follow.  Pt seen by , MSN, APN-BC/Nurse Practitioner/Neuro and later by MD.  Pager:  Neurology Attending Attestation   I examined the patient and discussed plan with Ms. Jimmye Norman NP. Above note has been edited by me to reflect my findings and recommendations.    [phone], MD [facility] [phone]   If 7pm- 7am, please page neurology on call as listed in AMION.

## 2020-08-19 NOTE — Plan of Care (Signed)
  Problem: Education: Goal: Knowledge of General Education information will improve Description: Including pain rating scale, medication(s)/side effects and non-pharmacologic comfort measures Outcome: Progressing   Problem: Health Behavior/Discharge Planning: Goal: Ability to manage health-related needs will improve Outcome: Progressing   Problem: Clinical Measurements: Goal: Ability to maintain clinical measurements within normal limits will improve Outcome: Progressing Goal: Will remain free from infection Outcome: Progressing Goal: Diagnostic test results will improve Outcome: Progressing Goal: Respiratory complications will improve Outcome: Progressing Goal: Cardiovascular complication will be avoided Outcome: Progressing   Problem: Activity: Goal: Risk for activity intolerance will decrease Outcome: Progressing   Problem: Nutrition: Goal: Adequate nutrition will be maintained Outcome: Progressing   Problem: Coping: Goal: Level of anxiety will decrease Outcome: Progressing   Problem: Elimination: Goal: Will not experience complications related to bowel motility Outcome: Progressing Goal: Will not experience complications related to urinary retention Outcome: Progressing   Problem: Pain Managment: Goal: General experience of comfort will improve Outcome: Progressing   Problem: Safety: Goal: Ability to remain free from injury will improve Outcome: Progressing   Problem: Skin Integrity: Goal: Risk for impaired skin integrity will decrease Outcome: Progressing   Problem: Education: Goal: Expressions of having a comfortable level of knowledge regarding the disease process will increase Outcome: Progressing   Problem: Coping: Goal: Ability to adjust to condition or change in health will improve Outcome: Progressing Goal: Ability to identify appropriate support needs will improve Outcome: Progressing   Problem: Health Behavior/Discharge Planning: Goal:  Compliance with prescribed medication regimen will improve Outcome: Progressing   Problem: Medication: Goal: Risk for medication side effects will decrease Outcome: Progressing   Problem: Clinical Measurements: Goal: Complications related to the disease process, condition or treatment will be avoided or minimized Outcome: Progressing Goal: Diagnostic test results will improve Outcome: Progressing   Problem: Safety: Goal: Verbalization of understanding the information provided will improve Outcome: Progressing   Problem: Self-Concept: Goal: Level of anxiety will decrease Outcome: Progressing Goal: Ability to verbalize feelings about condition will improve Outcome: Progressing   Problem: Education: Goal: Knowledge of disease or condition will improve Outcome: Progressing Goal: Knowledge of secondary prevention will improve Outcome: Progressing Goal: Knowledge of patient specific risk factors addressed and post discharge goals established will improve Outcome: Progressing Goal: Individualized Educational Video(s) Outcome: Progressing   Problem: Coping: Goal: Will verbalize positive feelings about self Outcome: Progressing Goal: Will identify appropriate support needs Outcome: Progressing   Problem: Health Behavior/Discharge Planning: Goal: Ability to manage health-related needs will improve Outcome: Progressing   Problem: Self-Care: Goal: Ability to participate in self-care as condition permits will improve Outcome: Progressing Goal: Verbalization of feelings and concerns over difficulty with self-care will improve Outcome: Progressing Goal: Ability to communicate needs accurately will improve Outcome: Progressing   Problem: Nutrition: Goal: Risk of aspiration will decrease Outcome: Progressing Goal: Dietary intake will improve Outcome: Progressing   Problem: Ischemic Stroke/TIA Tissue Perfusion: Goal: Complications of ischemic stroke/TIA will be  minimized Outcome: Progressing

## 2020-08-19 NOTE — Progress Notes (Signed)
Informed by RN that patient's neighbor and medical POA wants her CODE STATUS to be changed to DNR.  Patient is currently awake and alert.  She is confused and does not know why she is admitted to the hospital but is otherwise oriented to person, place, and time and is able to answer questions appropriately.  I discussed CODE STATUS with her and she is not sure if she wants to be DNR.  Please readdress in the morning.

## 2020-08-19 NOTE — Progress Notes (Signed)
LTM EEG discontinued - no skin breakdown at unhook.   

## 2020-08-19 NOTE — Procedures (Addendum)
Patient Name: Jodi Barr  MRN: [id number]  Epilepsy Attending: Charlsie Quest  Referring Physician/Provider: Jimmye Norman, NP Duration: 08/18/2020 1134 to 08/19/2020 1134   Patient history: 87 year old female with seizure-like activity and left-sided weakness.  EEG generally for seizures.   Level of alertness: Awake, asleep   AEDs during EEG study: Keppra   Technical aspects: This EEG study was done with scalp electrodes positioned according to the 10-20 International system of electrode placement. Electrical activity was acquired at a sampling rate of 500Hz  and reviewed with a high frequency filter of 70Hz  and a low frequency filter of 1Hz . EEG data were recorded continuously and digitally stored.   Description: No posterior dominant rhythm was seen. Sleep was characterized by vertex waves, sleep spindles (12-14h), maximal frontocentral region. EEG showed continuous generalized mixed frequencies with predominantly  5 to 7 Hz theta slowing admixed with 15-18hz  generalized beta activity. Hyperventilation and photic stimulation were not performed.      ABNORMALITY - Continuous slow, generalized    IMPRESSION: This study is suggestive of moderate diffuse encephalopathy, nonspecific etiology  No seizures or definite epileptiform discharges were seen throughout the recording.  EEG appears to be improving compared to previous day.    [name] 

## 2020-08-19 NOTE — Progress Notes (Signed)
PROGRESS NOTE    Jodi Barr  INO:[id number] DOB: [birth year] DOA: 08/17/2020 PCP: Geoffry Paradise, MD   No chief complaint on file.   Brief Narrative:  Jodi Barr is a 87 y.o. female with medical history significant of HTN, HLD, GERD, chronic T12 compression fracture, constipation presented to hospital nonresponsive.  Patient was having back pain and was on muscle relaxant as outpatient .  The patient's health care power of attorney/neighbor checked on her and was found to be unresponsive.  EMS was called in.  Patient had left-sided weakness and witnessed seizures in the ambulance..      In the ED patient was given 2 g of Keppra IV.  She was nonverbal.  Code stroke was called in as well.  Neurology was consulted and EEG was performed.  CT spine was normal.  CT angiogram of the neck showed no plaque.  Patient was then admitted to hospital for further evaluation and treatment.  Assessment & Plan:   Principal Problem:   Seizure (HCC) Active Problems:   Hyperlipidemia   Essential hypertension   Acute left-sided weakness   Altered mental status   Leukocytosis   Hyponatremia   Enlarged thyroid   Low back pain   Acute metabolic encephalopathy;  Initial CT head is negative.  Probably secondary to seizures brought on by ? Hyponatremia.  Vs polypharmacy vs post ictal state.  Pt is calmer this afternoon. Neurology on board and she is currently on LTM EEG.  Continue with keppra 500 mg BID.  Neurology recommended getting MRI brain.    Hypertension;  BP parameters are optimal.    Hyperlipidemia:  Continue with lipitor. LDL is 143   Hyponatremia:  Probably secondary to seizures.  Improving with IV fluids.    Hypomagnesemia:  Replaced.   Lactic acidosis:  Probably from dehydration.    Leukocytosis:  Unclear etiology.  CXR ordered UA is negative.    T12 compression fracture:  Pain control and Therapy evaluations are pending.    DVT prophylaxis:  (Lovenox) Code Status: (Full code) Family Communication: none at bedside.  Disposition:   Status is: Inpatient  Remains inpatient appropriate because:Ongoing diagnostic testing needed not appropriate for outpatient work up, Unsafe d/c plan, and IV treatments appropriate due to intensity of illness or inability to take PO  Dispo: The patient is from: Home              Anticipated d/c is to:  pending              Patient currently is not medically stable to d/c.   Difficult to place patient No       Consultants:  Neurology.   Procedures: EEG  Antimicrobials:  Antibiotics Given (last 72 hours)     Date/Time Action Medication Dose Rate   08/18/20 0036 New Bag/Given  [med unavailable from pharmacy]   cefTRIAXone (ROCEPHIN) 2 g in sodium chloride 0.9 % 100 mL IVPB 2 g 200 mL/hr         Subjective: No new complaints.   Objective: Vitals:   08/19/20 0022 08/19/20 0348 08/19/20 0800 08/19/20 1220  BP: (!) 148/67 (!) 147/79 (!) 166/130 140/88  Pulse: 74 98 86 93  Resp: 17 17 20 18   Temp: 97.8 F (36.6 C) 98.6 F (37 C) 98.6 F (37 C) 98.7 F (37.1 C)  TempSrc: Oral   Oral  SpO2: 95%  96% 99%  Weight:        Intake/Output Summary (Last 24 hours) at  08/19/2020 1326 Last data filed at 08/19/2020 1222 Gross per 24 hour  Intake 1611.85 ml  Output 1865 ml  Net -253.15 ml   Filed Weights   08/17/20 0900  Weight: 53 kg    Examination:  General exam: Appears calm and comfortable  Respiratory system: Clear to auscultation. Respiratory effort normal. Cardiovascular system: S1 & S2 heard, irregularly irregular, No JVD,  No pedal edema. Gastrointestinal system: Abdomen is nondistended, soft and nontender. . Normal bowel sounds heard. Central nervous system: confused, lethargic.  Extremities:no pedal edema.  Skin: No rashes, lesions or ulcers Psychiatry:  flat affect.     Data Reviewed: I have personally reviewed following labs and imaging studies  CBC: Recent  Labs  Lab 08/17/20 0900 08/17/20 0914 08/17/20 1450 08/18/20 0148 08/19/20 0828  WBC 20.0*  --  19.0* 16.2* 14.0*  NEUTROABS 17.7*  --  16.6* 13.6* 11.7*  HGB 13.3 13.9 13.0 14.3 12.3  HCT 39.1 41.0 37.4 40.8 36.9  MCV 93.8  --  91.4 90.9 95.3  PLT 371  --  348 323 284    Basic Metabolic Panel: Recent Labs  Lab 08/17/20 0900 08/17/20 0914 08/17/20 1450 08/18/20 0148 08/19/20 0828  NA 121* 121* 128* 129* 131*  K 3.9 3.9 3.0* 3.6 3.7  CL 88* 88* 94* 92* 102  CO2 19*  --  23 23 15*  GLUCOSE 178* 176* 140* 113* 99  BUN 14 14 11 11 23   CREATININE 1.08* 0.90 0.84 0.92 0.98  CALCIUM 9.1  --  8.2* 8.7* 8.5*  MG  --   --  1.4*  --   --     GFR: CrCl cannot be calculated (Unknown ideal weight.).  Liver Function Tests: Recent Labs  Lab 08/17/20 0900 08/18/20 0148  AST 44* 58*  ALT 32 35  ALKPHOS 77 69  BILITOT 0.9 1.0  PROT 6.4* 5.7*  ALBUMIN 3.6 3.1*    CBG: Recent Labs  Lab 08/17/20 0858  GLUCAP 193*     Recent Results (from the past 240 hour(s))  Resp Panel by RT-PCR (Flu A&B, Covid) Nasopharyngeal Swab     Status: None   Collection Time: 08/17/20 10:22 AM   Specimen: Nasopharyngeal Swab; Nasopharyngeal(NP) swabs in vial transport medium  Result Value Ref Range Status   SARS Coronavirus 2 by RT PCR NEGATIVE NEGATIVE Final    Comment: (NOTE) SARS-CoV-2 target nucleic acids are NOT DETECTED.  The SARS-CoV-2 RNA is generally detectable in upper respiratory specimens during the acute phase of infection. The lowest concentration of SARS-CoV-2 viral copies this assay can detect is 138 copies/mL. A negative result does not preclude SARS-Cov-2 infection and should not be used as the sole basis for treatment or other patient management decisions. A negative result may occur with  improper specimen collection/handling, submission of specimen other than nasopharyngeal swab, presence of viral mutation(s) within the areas targeted by this assay, and inadequate  number of viral copies(<138 copies/mL). A negative result must be combined with clinical observations, patient history, and epidemiological information. The expected result is Negative.  Fact Sheet for Patients:  BloggerCourse.comhttps://www.fda.gov/media/152166/download  Fact Sheet for Healthcare Providers:  SeriousBroker.ithttps://www.fda.gov/media/152162/download  This test is no t yet approved or cleared by the [place] FDA and  has been authorized for detection and/or diagnosis of SARS-CoV-2 by FDA under an Emergency Use Authorization (EUA). This EUA will remain  in effect (meaning this test can be used) for the duration of the COVID-19 declaration under Section 564(b)(1) of the Act, 21  U.S.C.section 360bbb-3(b)(1), unless the authorization is terminated  or revoked sooner.       Influenza A by PCR NEGATIVE NEGATIVE Final   Influenza B by PCR NEGATIVE NEGATIVE Final    Comment: (NOTE) The Xpert Xpress SARS-CoV-2/FLU/RSV plus assay is intended as an aid in the diagnosis of influenza from Nasopharyngeal swab specimens and should not be used as a sole basis for treatment. Nasal washings and aspirates are unacceptable for Xpert Xpress SARS-CoV-2/FLU/RSV testing.  Fact Sheet for Patients: BloggerCourse.com  Fact Sheet for Healthcare Providers: SeriousBroker.it  This test is not yet approved or cleared by the [place] FDA and has been authorized for detection and/or diagnosis of SARS-CoV-2 by FDA under an Emergency Use Authorization (EUA). This EUA will remain in effect (meaning this test can be used) for the duration of the COVID-19 declaration under Section 564(b)(1) of the Act, 21 U.S.C. section 360bbb-3(b)(1), unless the authorization is terminated or revoked.  Performed at [facility], [address] [place], [place] [address]   Culture, blood (routine x 2)     Status: None (Preliminary result)   Collection Time: 08/17/20  1:20 PM    Specimen: BLOOD  Result Value Ref Range Status   Specimen Description BLOOD RIGHT ANTECUBITAL  Final   Special Requests   Final    BOTTLES DRAWN AEROBIC AND ANAEROBIC Blood Culture results may not be optimal due to an inadequate volume of blood received in culture bottles   Culture   Final    NO GROWTH 2 DAYS Performed at [facility], [address] [place], [place] [address]    Report Status PENDING  Incomplete  Culture, blood (routine x 2)     Status: None (Preliminary result)   Collection Time: 08/17/20  2:50 PM   Specimen: BLOOD  Result Value Ref Range Status   Specimen Description BLOOD RIGHT ANTECUBITAL  Final   Special Requests   Final    BOTTLES DRAWN AEROBIC AND ANAEROBIC Blood Culture results may not be optimal due to an inadequate volume of blood received in culture bottles   Culture   Final    NO GROWTH 2 DAYS Performed at [facility], [address] [place], [place] [address]    Report Status PENDING  Incomplete         Radiology Studies: CT HEAD WO CONTRAST  Result Date: 08/18/2020 CLINICAL DATA:  Seizure EXAM: CT HEAD WITHOUT CONTRAST TECHNIQUE: Contiguous axial images were obtained from the base of the skull through the vertex without intravenous contrast. COMPARISON:  August 17, 2020 FINDINGS: Evaluation is limited secondary to streak artifact from overlying EEG leads. Brain: No evidence of acute infarction, hemorrhage, hydrocephalus, extra-axial collection or mass lesion/mass effect. Periventricular white matter hypodensities consistent with sequela of chronic microvascular ischemic disease. General parenchymal volume loss. Vascular: Vascular calcifications of the carotid siphons. Skull: No acute fracture. Sinuses/Orbits: No acute finding. Other: None. IMPRESSION: No acute intracranial abnormality. Electronically Signed   By: Meda Klinefelter MD   On: 08/18/2020 20:31   DG CHEST PORT 1 VIEW  Result Date: 08/17/2020 CLINICAL DATA:  Seizure. EXAM:  PORTABLE CHEST 1 VIEW COMPARISON:  None. FINDINGS: There is a small left effusion with underlying opacity, likely atelectasis. Probable mild cardiomegaly. The hila and mediastinum are unremarkable. No pneumothorax. No nodules or masses. No overt edema. Mild pulmonary venous congestion not excluded. IMPRESSION: Small left effusion with underlying opacity, likely atelectasis. Mild pulmonary venous congestion is not excluded but there is  no overt edema. Electronically Signed   By: Gerome Sam III M.D   On: 08/17/2020 13:51   Overnight EEG with video  Result Date: 08/18/2020 Charlsie Quest, MD     08/19/2020  8:52 AM Patient Name: Jodi Barr MRN: [id number] Epilepsy Attending: Charlsie Quest Referring Physician/Provider: Jimmye Norman, NP Duration: 08/17/2020 1134 to 08/18/2020 1134  Patient history: 87 year old female with seizure-like activity and left-sided weakness.  EEG generally for seizures.  Level of alertness: Awake  AEDs during EEG study: Keppra  Technical aspects: This EEG study was done with scalp electrodes positioned according to the 10-20 International system of electrode placement. Electrical activity was acquired at a sampling rate of 500Hz  and reviewed with a high frequency filter of 70Hz  and a low frequency filter of 1Hz . EEG data were recorded continuously and digitally stored.  Description: EEG showed continuous generalized 5 to 7 Hz theta as well as intermittent generalized 2 to 3 Hz delta slowing. Generalized periodic discharges with triphasic morphology at 1Hz  were also noted intermittently, moe commonly when awake/stimulated. Hyperventilation and photic stimulation were not performed.    ABNORMALITY - Continuous slow, generalized - Periodic discharges with triphasic morphology, generalized ( GPDs)  IMPRESSION: This study is suggestive of moderate diffuse encephalopathy, nonspecific etiology but could be secondary to toxic-metabolic causes. Of note, generalized periodic  discharges with triphasic morphology can be on the ictal-interictal continuum. However, the frequency and morphology of the discharges as well as reactivity to stimulation in this patient is more likely indicative of toxic-metabolic etiology. No seizures were seen throughout the recording.    ECHOCARDIOGRAM COMPLETE  Result Date: 08/17/2020    ECHOCARDIOGRAM REPORT   Patient Name:   Jodi Barr Date of Exam: 08/17/2020 Medical Rec #:  Charlsie Quest        Height:       63.0 in Accession #:    10/17/2020       Weight:       116.8 lb Date of Birth:  [birth year]        BSA:          1.539 m Patient Age:    88 years         BP:           191/96 mmHg Patient Gender: F                HR:           108 bpm. Exam Location:  Inpatient Procedure: 2D Echo, Cardiac Doppler and Color Doppler Indications:    Stroke  History:        Patient has no prior history of Echocardiogram examinations.                 Risk Factors:Hypertension.  Sonographer:    10/17/2020 Referring Phys: [id number]  Sonographer Comments: Image acquisition challenging due to uncooperative patient. IMPRESSIONS  1. Left ventricular ejection fraction, by estimation, is 60 to 65%. The left ventricle has normal function. The left ventricle has no regional wall motion abnormalities. There is mild left ventricular hypertrophy of the basal-septal segment. Left ventricular diastolic function could not be evaluated.  2. Right ventricular systolic function is normal. The right ventricular size is normal.  3. The mitral valve is normal in structure. Mild mitral valve regurgitation. No evidence of mitral stenosis.  4. The aortic valve is tricuspid. There is moderate calcification of the aortic valve. There is moderate thickening  of the aortic valve. Aortic valve regurgitation is not visualized. Mild to moderate aortic valve sclerosis/calcification is present, without any evidence of aortic stenosis.  5. The inferior vena cava is  normal in size with greater than 50% respiratory variability, suggesting right atrial pressure of 3 mmHg. FINDINGS  Left Ventricle: Left ventricular ejection fraction, by estimation, is 60 to 65%. The left ventricle has normal function. The left ventricle has no regional wall motion abnormalities. The left ventricular internal cavity size was normal in size. There is  mild left ventricular hypertrophy of the basal-septal segment. Left ventricular diastolic function could not be evaluated due to atrial fibrillation. Left ventricular diastolic function could not be evaluated. Right Ventricle: The right ventricular size is normal. No increase in right ventricular wall thickness. Right ventricular systolic function is normal. Left Atrium: Left atrial size was normal in size. Right Atrium: Right atrial size was normal in size. Pericardium: There is no evidence of pericardial effusion. Mitral Valve: The mitral valve is normal in structure. Mild mitral annular calcification. Mild mitral valve regurgitation. No evidence of mitral valve stenosis. Tricuspid Valve: The tricuspid valve is normal in structure. Tricuspid valve regurgitation is mild . No evidence of tricuspid stenosis. Aortic Valve: The aortic valve is tricuspid. There is moderate calcification of the aortic valve. There is moderate thickening of the aortic valve. Aortic valve regurgitation is not visualized. Mild to moderate aortic valve sclerosis/calcification is present, without any evidence of aortic stenosis. Aortic valve mean gradient measures 5.5 mmHg. Aortic valve peak gradient measures 9.5 mmHg. Aortic valve area, by VTI measures 2.16 cm. Pulmonic Valve: The pulmonic valve was normal in structure. Pulmonic valve regurgitation is not visualized. No evidence of pulmonic stenosis. Aorta: The aortic root is normal in size and structure. Venous: The inferior vena cava is normal in size with greater than 50% respiratory variability, suggesting right atrial  pressure of 3 mmHg. IAS/Shunts: No atrial level shunt detected by color flow Doppler.  LEFT VENTRICLE PLAX 2D LVIDd:         3.10 cm LVIDs:         2.30 cm LV PW:         0.80 cm LV IVS:        1.20 cm LVOT diam:     2.00 cm LV SV:         60 LV SV Index:   39 LVOT Area:     3.14 cm  RIGHT VENTRICLE             IVC RV S prime:     14.10 cm/s  IVC diam: 1.20 cm LEFT ATRIUM             Index       RIGHT ATRIUM          Index LA diam:        2.70 cm 1.75 cm/m  RA Area:     9.25 cm LA Vol (A2C):   37.4 ml 24.31 ml/m RA Volume:   16.70 ml 10.85 ml/m LA Vol (A4C):   41.0 ml 26.64 ml/m LA Biplane Vol: 39.2 ml 25.48 ml/m  AORTIC VALVE AV Area (Vmax):    2.40 cm AV Area (Vmean):   2.30 cm AV Area (VTI):     2.16 cm AV Vmax:           154.50 cm/s AV Vmean:          110.500 cm/s AV VTI:  0.279 m AV Peak Grad:      9.5 mmHg AV Mean Grad:      5.5 mmHg LVOT Vmax:         117.93 cm/s LVOT Vmean:        80.967 cm/s LVOT VTI:          0.192 m LVOT/AV VTI ratio: 0.69  AORTA Ao Root diam: 2.60 cm Ao Asc diam:  2.60 cm TRICUSPID VALVE TR Peak grad:   30.0 mmHg TR Vmax:        274.00 cm/s  SHUNTS Systemic VTI:  0.19 m Systemic Diam: 2.00 cm Armanda Magic MD Electronically signed by Armanda Magic MD Signature Date/Time: 08/17/2020/3:53:30 PM    Final    US THYROID  Result Date: 08/17/2020 CLINICAL DATA:  Thyromegaly EXAM: THYROID ULTRASOUND TECHNIQUE: Ultrasound examination of the thyroid gland and adjacent soft tissues was performed. COMPARISON:  CT angiography head and neck 08/17/2020 FINDINGS: Parenchymal Echotexture: Moderately heterogeneous Isthmus: 0.6 cm Right lobe: 5.2 x 2.8 x 3.4 cm Left lobe: 5.8 x 2.6 x 3.5 cm _________________________________________________________ Estimated total number of nodules >/= 1 cm: 2 Number of spongiform nodules >/=  2 cm not described below (TR1): 0 Number of mixed cystic and solid nodules >/= 1.5 cm not described below (TR2): 0  _________________________________________________________ The right thyroid lobe is diffusely enlarged and goiterous. No discrete discernible nodule is identified. 1.0 x 0.8 x 0.7 cm spongiform nodule in the left thyroid lobe does not meet criteria for FNA or surveillance. IMPRESSION: Diffusely enlarged heterogeneous thyroid without discrete nodule which meets criteria for FNA or surveillance. The above is in keeping with the ACR TI-RADS recommendations - J Am Coll Radiol 2017;14:587-595. Electronically Signed   By: Acquanetta Belling M.D.   On: 08/17/2020 16:56        Scheduled Meds:  atorvastatin  40 mg Oral Daily   Chlorhexidine Gluconate Cloth  6 each Topical Daily   cyanocobalamin  1,000 mcg Intramuscular Daily   enoxaparin (LOVENOX) injection  40 mg Subcutaneous Q24H   Continuous Infusions:  sodium chloride 75 mL/hr at 08/19/20 1030   levETIRAcetam 500 mg (08/19/20 1018)     LOS: 2 days        Kathlen Mody, MD [facility]   To contact the attending provider between 7A-7P or the covering provider during after hours 7P-7A, please log into the web site [email or web address] and access using universal [place] password for that web site. If you do not have the password, please call the hospital operator.  08/19/2020, 1:26 PM

## 2020-08-19 NOTE — Progress Notes (Signed)
Pt noted new onset of afib with HR 120s to 149s, per tele, MD notified. Will continue to monitor.

## 2020-08-19 NOTE — Progress Notes (Signed)
SLP Cancellation Note  Patient Details Name: Jodi Barr MRN: [id number] DOB: August 29, 1932   Cancelled treatment:       Reason Eval/Treat Not Completed: Medical issues which prohibited therapy Altered mental status persists. Pt verbalizing "Lord, send someone to help me!" Repeatedly. Purses lips upon any PO trial attempts. ST service will follow as able.  Tiera Mensinger P. Rufus Cypert, M.S., CCC-SLP Speech-Language Pathologist Acute Rehabilitation Services Pager: [phone]   Susanne Borders Zaxton Angerer 08/19/2020, 8:30 AM

## 2020-08-19 NOTE — Procedures (Signed)
Patient Name: Jodi Barr  MRN: [id number]  Epilepsy Attending: Charlsie Quest  Referring Physician/Provider: Jimmye Norman, NP Duration: 08/19/2020 1134 to 08/19/2020 1355   Patient history: 87 year old female with seizure-like activity and left-sided weakness.  EEG generally for seizures.   Level of alertness: Awake, asleep   AEDs during EEG study: Keppra   Technical aspects: This EEG study was done with scalp electrodes positioned according to the 10-20 International system of electrode placement. Electrical activity was acquired at a sampling rate of 500Hz  and reviewed with a high frequency filter of 70Hz  and a low frequency filter of 1Hz . EEG data were recorded continuously and digitally stored.   Description: No posterior dominant rhythm was seen. Sleep was characterized by vertex waves, sleep spindles (12-14h), maximal frontocentral region. EEG showed continuous generalized mixed frequencies with predominantly  5 to 7 Hz theta slowing admixed with 15-18hz  generalized beta activity. Hyperventilation and photic stimulation were not performed.      ABNORMALITY - Continuous slow, generalized   IMPRESSION: This study is suggestive of moderate diffuse encephalopathy, nonspecific etiology  No seizures or definite epileptiform discharges were seen throughout the recording.    Sawsan Riggio 

## 2020-08-19 NOTE — Progress Notes (Signed)
Pt POA for health Jari Favre requests to sign DNR form with a physician to be placed in chart. States this accords with patient's choice, in discussions predating hospital stay. MD paged by RN during day shift Saturday and Sunday, no response. RN and night RN Gershon Cull paged MD together, no response. Night RN will continue to monitor.

## 2020-08-20 ENCOUNTER — Inpatient Hospital Stay (HOSPITAL_COMMUNITY): Payer: Medicare PPO

## 2020-08-20 DIAGNOSIS — R400 Somnolence: Secondary | ICD-10-CM

## 2020-08-20 LAB — HEMOGLOBIN A1C
Hgb A1c MFr Bld: 5.8 % — ABNORMAL HIGH (ref 4.8–5.6)
Mean Plasma Glucose: 120 mg/dL

## 2020-08-20 MED ORDER — LORAZEPAM 2 MG/ML IJ SOLN
0.5000 mg | Freq: Once | INTRAMUSCULAR | Status: AC
  Administered 2020-08-20: 0.5 mg via INTRAVENOUS
  Filled 2020-08-20: qty 1

## 2020-08-20 MED ORDER — METOPROLOL TARTRATE 25 MG PO TABS
25.0000 mg | ORAL_TABLET | Freq: Two times a day (BID) | ORAL | Status: DC
  Administered 2020-08-20 – 2020-08-23 (×7): 25 mg via ORAL
  Filled 2020-08-20 (×7): qty 1

## 2020-08-20 MED ORDER — QUETIAPINE FUMARATE 25 MG PO TABS
25.0000 mg | ORAL_TABLET | Freq: Every evening | ORAL | Status: DC | PRN
  Administered 2020-08-20 – 2020-08-27 (×2): 25 mg via ORAL
  Filled 2020-08-20 (×2): qty 1

## 2020-08-20 MED ORDER — GADOBUTROL 1 MMOL/ML IV SOLN
5.5000 mL | Freq: Once | INTRAVENOUS | Status: AC | PRN
  Administered 2020-08-20: 5.5 mL via INTRAVENOUS

## 2020-08-20 MED ORDER — LORAZEPAM 0.5 MG PO TABS: 0.5000 mg | ORAL_TABLET | Freq: Once | ORAL | Status: DC

## 2020-08-20 MED ORDER — ACETAMINOPHEN 500 MG PO TABS
1000.0000 mg | ORAL_TABLET | Freq: Three times a day (TID) | ORAL | Status: DC
  Administered 2020-08-20 – 2020-08-25 (×16): 1000 mg via ORAL
  Filled 2020-08-20 (×16): qty 2

## 2020-08-20 MED ORDER — LIDOCAINE 5 % EX PTCH
1.0000 | MEDICATED_PATCH | CUTANEOUS | Status: DC
  Administered 2020-08-20 – 2020-08-29 (×11): 1 via TRANSDERMAL
  Filled 2020-08-20 (×14): qty 1

## 2020-08-20 NOTE — Progress Notes (Signed)
PT Cancellation Note  Patient Details Name: Jodi Barr MRN: [id number] DOB: December 24, 1932   Cancelled Treatment:    Reason Eval/Treat Not Completed: Patient at procedure or test/unavailable (MRI, and received Ativan prior). Will check back as time allows but expect that she will do better tomorrow after ativan wears off.   Lyanne Co, PT  Acute Rehab Services  Pager [phone] Office [phone]    Jodi Barr 08/20/2020, 3:13 PM

## 2020-08-20 NOTE — Consult Note (Signed)
Consultation Note Date: 08/20/2020   Patient Name: Jodi Barr  DOB: 31-Dec-1932  MRN: [id number]  Age / Sex: 87 y.o., female  PCP: Geoffry Paradise, MD Referring Physician: Marinda Elk, MD  Reason for Consultation: Establishing goals of care  HPI/Patient Profile: 87 y.o. female  with past medical history of osteoarthritis, hypertension, hyperlipidemia, GERD, and chronic T12 compression fracture. She presented to the emergency department on 08/17/20 with altered mental status. She had reportedly been having back pain and was prescribed a muscle relaxer. On the morning of 08/17/20, she called her neighbor and asked him to come over because she was in pain. When he went to her house he found she had fallen down by her bed so he called EMS. When EMS arrived, patient was not confused but then had a witnessed seizure and left sided weakness in route.  On arrival to the ED, patient was altered and non-verbal. Code stroke was called and CT head was negative. CT spine negative. CTA head and neck was negative for LVO. Bedside EEG showed moderate diffuse encephalopathy nonspecific etiology but no seizures or epileptiform discharges seen.   Clinical Assessment and Goals of Care: I have reviewed medical records including EPIC notes, labs and imaging, and examined the patient. She is able to tell me she is at [facility]. She reports 9/10 back pain. She also endorses generalized weakness.   We discussed her life review. She has lived her entire life in [place]. 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 Her husband passed away approximately 10 years ago. They did not have children. She tells me that she has no other living family members.   I attempted to discuss code status with Mrs. Pedigo, however, she does not seem to fully understand the question and is not able to verbalize a clear  answer.  I spoke with patient's HCPOA Jari Favre and his wife Lillia Abed by phone to discuss diagnosis, prognosis, GOC, EOL wishes, disposition, and options. I introduced Palliative Medicine as specialized medical care for people living with serious illness. It focuses on providing relief from the symptoms and stress of a serious illness.   [name] reports that he has been Mrs. Rodocker's neighbor for about 6 years. He and Lillia Abed (along with several other neighbors) have helped her out over the years knowing that she did not have any family. He shares that she has become increasingly more "feeble" and he expresses concern regarding her ability to continue caring for herself at home.   As far as functional status, at baseline patient was ambulatory and independent with her ADLs. However she had injured her back about a week ago which had significantly affected her mobility and ability to care for herself. Neighbors have been bringing food, and Lillia Abed was in the process of getting her set up to receive "Meals on Wheels".   We discussed her current illness and what it means in the larger context of her ongoing co-morbidities.  Natural disease trajectory of chronic illness and  debility was discussed.  I attempted to elicit values and goals of care important to the patient. Lillia Abed shares that patient has a living will which specifies she would not want extraordinary measures to prolong her life. We did discuss code status. Encouraged [name] and Lillia Abed to continue DNR/DNI status for Mrs. Mcerlean understanding evidenced based poor outcomes in similar hospitalized patients, as the cause of the arrest is likely associated with chronic/terminal disease rather than a reversible acute cardio-pulmonary event.  I explained that DNR/DNI does not change the medical plan and it only comes into effect after a person has arrested (died).  It is a protective measure to keep [place] from harming the patient in their last moments of life.  I told [name] and Lillia Abed about my conversation with Mrs. Gerardo earlier today and that I didn't feel she was fully able to participate in complex decision making at that time. Lillia Abed agrees that patient seemed intermittently confused when she visited earlier. She also feels that DNR/DNI status is consistent with the wishes expressed in Mrs. Urista's living will.   Discussed disposition options with [name] and Lillia Abed; SNF for rehab versus home health with PT. Emphasized that the focus of SNF rehab or home health with PT is improvement or at least stabilization of functional status. Discussed the probability that Mrs. Rothery may not return to her previous baseline. Discussed that after rehab, if she was not well enough to return home she may need to remain in the SNF for long-term care.   Hospice and Palliative Care services outpatient were explained and offered.  Questions and concerns were addressed. [name] and Lillia Abed were provided with PMT contact info and encouraged to call with questions or concerns.    Primary decision maker: I am not sure patient can participate in complex medical decision making yet.  HCPOA is Jari Favre (a copy of this document has been placed on patient's shadow chart    SUMMARY OF RECOMMENDATIONS   Continue DNR/DNI status as previously documented Continue current medical care HCPOA/neighbor expresses concern over patient's ability to care for herself at home Disposition likely SNF for rehab Recommend outpatient palliative at discharge PMT will continue to follow  Code Status/Advance Care Planning: DNR  Symptom Management:  Scheduled tylenol 1000 mg every 8 hours for back pain  Palliative Prophylaxis:  Frequent Pain Assessment and Turn Reposition  Additional Recommendations (Limitations, Scope, Preferences): Full Scope Treatment  Psycho-social/Spiritual:  Desire for further Chaplaincy support:yes ([employer] faith)  Prognosis:  Unable to  determine  Discharge Planning: likely SNF for rehab     Primary Diagnoses: Present on Admission:  Altered mental status  Essential hypertension  Hyperlipidemia  Leukocytosis  Hyponatremia  Enlarged thyroid  Low back pain   I have reviewed the medical record, interviewed the patient and family, and examined the patient. The following aspects are pertinent.  Past Medical History:  Diagnosis Date   Allergies    Arthritis    Hypertension      Family History  Problem Relation Age of Onset   Heart disease Mother    Scheduled Meds:  atorvastatin  40 mg Per Tube Daily   Chlorhexidine Gluconate Cloth  6 each Topical Daily   enoxaparin (LOVENOX) injection  40 mg Subcutaneous Q24H   lidocaine  1 patch Transdermal Q24H   LORazepam  0.5 mg Oral Once   metoprolol tartrate  25 mg Oral BID   Continuous Infusions:  sodium chloride 75 mL/hr at 08/20/20 0609   levETIRAcetam 500 mg (08/20/20  1141)   PRN Meds:.acetaminophen **OR** acetaminophen (TYLENOL) oral liquid 160 mg/5 mL **OR** acetaminophen, hydrALAZINE, QUEtiapine Medications Prior to Admission:  Prior to Admission medications   Medication Sig Start Date End Date Taking? Authorizing Provider  acetaminophen (TYLENOL) 500 MG tablet Take 500 mg by mouth every 6 (six) hours as needed.    [provider]  celecoxib (CELEBREX) 200 MG capsule Take 200 mg by mouth 2 (two) times daily.    [provider]  cholecalciferol (VITAMIN D) 1000 UNITS tablet Take 1,000 Units by mouth daily.    [provider]  fexofenadine (ALLEGRA) 180 MG tablet Take 180 mg by mouth daily.    [provider]  fluticasone (FLONASE) 50 MCG/ACT nasal spray Place into both nostrils daily.    [provider]  Hyoscyamine Sulfate SL (LEVSIN/SL) 0.125 MG SUBL Place 1-2 tablets by mouth under tongue every 4 hours 11/17/19   Meryl DareStark, Malcolm T, MD  Menthol, Topical Analgesic, (BIOFREEZE) 4 % GEL Apply 1 Dose topically as  needed.    [provider]  metaxalone (SKELAXIN) 800 MG tablet  02/15/13   [provider]  methocarbamol (ROBAXIN) 500 MG tablet Take 500 mg by mouth every 8 (eight) hours. 08/13/20   [provider]  metoprolol succinate (TOPROL-XL) 25 MG 24 hr tablet Take 1/2 tab po prn elevated bp and rest Patient taking differently: Take 12.5 mg by mouth daily. 10/31/14   Jonita AlbeeGuest, Chris W, MD  olmesartan (BENICAR) 40 MG tablet Take 40 mg by mouth daily. 07/20/20   [provider]  olmesartan-hydrochlorothiazide (BENICAR HCT) 40-12.5 MG per tablet Take 1 tablet by mouth daily.    [provider]  ondansetron (ZOFRAN-ODT) 4 MG disintegrating tablet Take 4 mg by mouth every 6 (six) hours as needed. 06/20/20   [provider]  polyethylene glycol powder (GLYCOLAX/MIRALAX) powder Take 17 g by mouth 2 (two) times daily as needed for mild constipation. 07/07/15   Sherren MochaShaw, Eva N, MD  predniSONE (DELTASONE) 20 MG tablet Take 20 mg by mouth daily. 08/15/20   [provider]  Probiotic Product (ALIGN PO) Take by mouth daily.    [provider]   Allergies  Allergen Reactions   Aspirin     REACTION: shortness of breath   Diclofenac Sodium     REACTION: Fluttering heart   Diltiazem Hcl     REACTION: Headache   Omeprazole    Penicillins     REACTION: Headache   Prednisone     REACTION: stomach pain, headache   Tramadol Hcl     REACTION: does not remember   Review of Systems  Musculoskeletal:  Positive for back pain.  Neurological:  Positive for weakness.   Physical Exam Vitals reviewed.  Constitutional:      General: She is not in acute distress.    Appearance: She is ill-appearing.  Cardiovascular:     Rate and Rhythm: Tachycardia present. Rhythm irregularly irregular.     Comments: A-fib Neurological:     Mental Status: She is lethargic and confused.     Motor: Weakness present.    Vital Signs: BP (!) 144/62 (BP Location: Left Arm)    Pulse 99   Temp 98.1 F (36.7 C) (Oral)   Resp 20   Wt 53 kg   SpO2 96%   BMI 20.70 kg/m  Pain Scale: Faces   Pain Score: Asleep   SpO2: SpO2: 96 % O2 Device:SpO2: 96 %   IO: Intake/output summary:  Intake/Output Summary (  Last 24 hours) at 08/20/2020 1154 Last data filed at 08/20/2020 6378 Gross per 24 hour  Intake 1892.67 ml  Output 385 ml  Net 1507.67 ml    LBM:   Baseline Weight: Weight: 53 kg Most recent weight: Weight: 53 kg      Palliative Assessment/Data: PPS 30%     Time In: 1400 Time Out: 1512 Time Total: 72 minutes Greater than 50%  of this time was spent counseling and coordinating care related to the above assessment and plan.  Signed by: Merry Proud, NP   Please contact Palliative Medicine Team phone at [phone] for questions and concerns.  For individual provider: See Loretha Stapler

## 2020-08-20 NOTE — Progress Notes (Signed)
[facility] PROGRESS NOTE    Progress Note  Jodi Barr  ZOX:[id number] DOB: March 25, 1932 DOA: 08/17/2020 PCP: Geoffry ParadiseAronson, Richard, MD     Brief Narrative:   Jodi Barr is an 87 y.o. female past medical history of essential hypertension hyperlipidemia chronic T12 compression, constipation comes into the hospital she was found to be unresponsive.  Patient was having back pain and started on muscle relaxant as an outpatient.  In the ED was found to have left-sided weakness and weakness effusion started on Keppra EEG was done that showed encephalopathy CT spine normal, CT angio of the head and neck no acute findings, CT of the head showed no acute intracranial abnormalities.    Assessment/Plan:   Acute metabolic encephalopathy: CT of the head negative, EEG showed showed no signs of seizures.  At home she was on Skelaxin and Robaxin, also her etiology for her seizure is probably medication plus or minus hyponatremia. She was loaded with Keppra.  There was a witnessed seizure on 08/17/2020. Neurology on board recommended MRI when stable continue Keppra EEG already done. MRI still pending, her sodium is improved to 131 with IV fluids. She had a leukocytosis on admission which is slowly improving. Overnight health care durable medical power of attorney discussed with the night physician and she was made a DNR/DNI. She seems to be more awake this morning we will try MRI.  Hypovolemic hyponatremia: Probably contributing to seizures resolved with IV fluids.  This morning sodium was 131.  Leukocytosis: Of unclear etiology no signs of infection question stress demargination due to seizures.  Off antibiotics afebrile white blood cell count down.  Essential hypertension: Blood pressure stable this morning.  Continue to monitor. Mildly tachycardic overnight.  Hyperlipidemia: Continue statins.  Hypomagnesemia: Repleted try to keep greater than 2.  Lactic acidosis:  in the  setting of seizures and dehydration now resolved.  History of T12 compression fracture: Avoid narcotics we will get physical therapy on board.     DVT prophylaxis: lovenox Family Communication:none Status is: Inpatient  Remains inpatient appropriate because:Hemodynamically unstable  Dispo: The patient is from: Home              Anticipated d/c is to: SNF              Patient currently is not medically stable to d/c.   Difficult to place patient No        Code Status:     Code Status Orders  (From admission, onward)           Start     Ordered   08/17/20 1338  Full code  Continuous        08/17/20 1342           Code Status History     This patient has a current code status but no historical code status.         IV Access:   Peripheral IV   Procedures and diagnostic studies:   CT HEAD WO CONTRAST  Result Date: 08/18/2020 CLINICAL DATA:  Seizure EXAM: CT HEAD WITHOUT CONTRAST TECHNIQUE: Contiguous axial images were obtained from the base of the skull through the vertex without intravenous contrast. COMPARISON:  August 17, 2020 FINDINGS: Evaluation is limited secondary to streak artifact from overlying EEG leads. Brain: No evidence of acute infarction, hemorrhage, hydrocephalus, extra-axial collection or mass lesion/mass effect. Periventricular white matter hypodensities consistent with sequela of chronic microvascular ischemic disease. General parenchymal volume loss. Vascular: Vascular calcifications of the  carotid siphons. Skull: No acute fracture. Sinuses/Orbits: No acute finding. Other: None. IMPRESSION: No acute intracranial abnormality. Electronically Signed   By: Meda Klinefelter MD   On: 08/18/2020 20:31   Overnight EEG with video  Result Date: 08/18/2020 Charlsie Quest, MD     08/19/2020  8:52 AM Patient Name: Jodi Barr MRN: [id number] Epilepsy Attending: Charlsie Quest Referring Physician/Provider: Jimmye Norman, NP Duration:  08/17/2020 1134 to 08/18/2020 1134  Patient history: 87 year old female with seizure-like activity and left-sided weakness.  EEG generally for seizures.  Level of alertness: Awake  AEDs during EEG study: Keppra  Technical aspects: This EEG study was done with scalp electrodes positioned according to the 10-20 International system of electrode placement. Electrical activity was acquired at a sampling rate of 500Hz  and reviewed with a high frequency filter of 70Hz  and a low frequency filter of 1Hz . EEG data were recorded continuously and digitally stored.  Description: EEG showed continuous generalized 5 to 7 Hz theta as well as intermittent generalized 2 to 3 Hz delta slowing. Generalized periodic discharges with triphasic morphology at 1Hz  were also noted intermittently, moe commonly when awake/stimulated. Hyperventilation and photic stimulation were not performed.    ABNORMALITY - Continuous slow, generalized - Periodic discharges with triphasic morphology, generalized ( GPDs)  IMPRESSION: This study is suggestive of moderate diffuse encephalopathy, nonspecific etiology but could be secondary to toxic-metabolic causes. Of note, generalized periodic discharges with triphasic morphology can be on the ictal-interictal continuum. However, the frequency and morphology of the discharges as well as reactivity to stimulation in this patient is more likely indicative of toxic-metabolic etiology. No seizures were seen throughout the recording.      Medical Consultants:   None.   Subjective:    she is awake this morning relates she is hungry.  Objective:    Vitals:   08/19/20 2223 08/19/20 2324 08/20/20 0452 08/20/20 0644  BP: (!) 175/90 (!) 125/50 (!) 163/99 (!) 135/59  Pulse: 99 (!) 107 (!) 102 (!) 109  Resp:  20 20 20   Temp:  97.7 F (36.5 C) 97.7 F (36.5 C)   TempSrc:   Oral   SpO2: 99% 99% 100% 98%  Weight:       SpO2: 98 % O2 Flow Rate (L/min): 2  L/min   Intake/Output Summary (Last 24 hours) at 08/20/2020 0647 Last data filed at 08/20/2020 0609 Gross per 24 hour  Intake 1892.67 ml  Output 385 ml  Net 1507.67 ml   Filed Weights   08/17/20 0900  Weight: 53 kg    Exam: General exam: In no acute distress. Respiratory system: Good air movement and clear to auscultation. Cardiovascular system: S1 & S2 heard, RRR. No JVD, murmurs, rubs, gallops or clicks.  Gastrointestinal system: Abdomen is nondistended, soft and nontender.  Central nervous system: She is awake alert and oriented x2 no focal deficits on physical exam she knows he is in the hospital she knows she is summer, she relates having some bad nightmares. She says she can lay still for 45 minutes for her MRI. Extremities: No pedal edema. Skin: No rashes, lesions or ulcers    Data Reviewed:    Labs: Basic Metabolic Panel: Recent Labs  Lab 08/17/20 0900 08/17/20 0914 08/17/20 1450 08/18/20 0148 08/19/20 0828  NA 121* 121* 128* 129* 131*  K 3.9 3.9 3.0* 3.6 3.7  CL 88* 88* 94* 92* 102  CO2 19*  --  23 23 15*  GLUCOSE  178* 176* 140* 113* 99  BUN 14 14 11 11 23   CREATININE 1.08* 0.90 0.84 0.92 0.98  CALCIUM 9.1  --  8.2* 8.7* 8.5*  MG  --   --  1.4*  --   --    GFR CrCl cannot be calculated (Unknown ideal weight.). Liver Function Tests: Recent Labs  Lab 08/17/20 0900 08/18/20 0148  AST 44* 58*  ALT 32 35  ALKPHOS 77 69  BILITOT 0.9 1.0  PROT 6.4* 5.7*  ALBUMIN 3.6 3.1*   No results for input(s): LIPASE, AMYLASE in the last 168 hours. No results for input(s): AMMONIA in the last 168 hours. Coagulation profile Recent Labs  Lab 08/17/20 0900  INR 1.0   COVID-19 Labs  No results for input(s): DDIMER, FERRITIN, LDH, CRP in the last 72 hours.  Lab Results  Component Value Date   SARSCOV2NAA NEGATIVE 08/17/2020    CBC: Recent Labs  Lab 08/17/20 0900 08/17/20 0914 08/17/20 1450 08/18/20 0148 08/19/20 0828  WBC 20.0*  --  19.0* 16.2*  14.0*  NEUTROABS 17.7*  --  16.6* 13.6* 11.7*  HGB 13.3 13.9 13.0 14.3 12.3  HCT 39.1 41.0 37.4 40.8 36.9  MCV 93.8  --  91.4 90.9 95.3  PLT 371  --  348 323 284   Cardiac Enzymes: Recent Labs  Lab 08/17/20 1450  CKTOTAL 964*   BNP (last 3 results) No results for input(s): PROBNP in the last 8760 hours. CBG: Recent Labs  Lab 08/17/20 0858  GLUCAP 193*   D-Dimer: No results for input(s): DDIMER in the last 72 hours. Hgb A1c: No results for input(s): HGBA1C in the last 72 hours. Lipid Profile: Recent Labs    08/18/20 0148  CHOL 220*  HDL 63  LDLCALC 143*  TRIG 69  CHOLHDL 3.5   Thyroid function studies: Recent Labs    08/17/20 1320  TSH 0.355   Anemia work up: Recent Labs    08/17/20 1320  VITAMINB12 306  FOLATE 13.1   Sepsis Labs: Recent Labs  Lab 08/17/20 0900 08/17/20 1347 08/17/20 1450 08/17/20 1757 08/17/20 2206 08/18/20 0148 08/19/20 0828  WBC 20.0*  --  19.0*  --   --  16.2* 14.0*  LATICACIDVEN  --  1.7  --  2.4* 2.2*  --  1.9   Microbiology Recent Results (from the past 240 hour(s))  Resp Panel by RT-PCR (Flu A&B, Covid) Nasopharyngeal Swab     Status: None   Collection Time: 08/17/20 10:22 AM   Specimen: Nasopharyngeal Swab; Nasopharyngeal(NP) swabs in vial transport medium  Result Value Ref Range Status   SARS Coronavirus 2 by RT PCR NEGATIVE NEGATIVE Final    Comment: (NOTE) SARS-CoV-2 target nucleic acids are NOT DETECTED.  The SARS-CoV-2 RNA is generally detectable in upper respiratory specimens during the acute phase of infection. The lowest concentration of SARS-CoV-2 viral copies this assay can detect is 138 copies/mL. A negative result does not preclude SARS-Cov-2 infection and should not be used as the sole basis for treatment or other patient management decisions. A negative result may occur with  improper specimen collection/handling, submission of specimen other than nasopharyngeal swab, presence of viral mutation(s)  within the areas targeted by this assay, and inadequate number of viral copies(<138 copies/mL). A negative result must be combined with clinical observations, patient history, and epidemiological information. The expected result is Negative.  Fact Sheet for Patients:  10/17/20  Fact Sheet for Healthcare Providers:  BloggerCourse.com  This test is no t yet approved  or cleared by the [place] and  has been authorized for detection and/or diagnosis of SARS-CoV-2 by FDA under an Emergency Use Authorization (EUA). This EUA will remain  in effect (meaning this test can be used) for the duration of the COVID-19 declaration under Section 564(b)(1) of the Act, 21 U.S.C.section 360bbb-3(b)(1), unless the authorization is terminated  or revoked sooner.       Influenza A by PCR NEGATIVE NEGATIVE Final   Influenza B by PCR NEGATIVE NEGATIVE Final    Comment: (NOTE) The Xpert Xpress SARS-CoV-2/FLU/RSV plus assay is intended as an aid in the diagnosis of influenza from Nasopharyngeal swab specimens and should not be used as a sole basis for treatment. Nasal washings and aspirates are unacceptable for Xpert Xpress SARS-CoV-2/FLU/RSV testing.  Fact Sheet for Patients: BloggerCourse.com  Fact Sheet for Healthcare Providers: SeriousBroker.it  This test is not yet approved or cleared by the [place] FDA and has been authorized for detection and/or diagnosis of SARS-CoV-2 by FDA under an Emergency Use Authorization (EUA). This EUA will remain in effect (meaning this test can be used) for the duration of the COVID-19 declaration under Section 564(b)(1) of the Act, 21 U.S.C. section 360bbb-3(b)(1), unless the authorization is terminated or revoked.  Performed at [facility], [address] [place], [place] [address]   Culture, blood (routine x 2)     Status: None  (Preliminary result)   Collection Time: 08/17/20  1:20 PM   Specimen: BLOOD  Result Value Ref Range Status   Specimen Description BLOOD RIGHT ANTECUBITAL  Final   Special Requests   Final    BOTTLES DRAWN AEROBIC AND ANAEROBIC Blood Culture results may not be optimal due to an inadequate volume of blood received in culture bottles   Culture   Final    NO GROWTH 2 DAYS Performed at [facility], [address], [place], [place] [address]    Report Status PENDING  Incomplete  Culture, blood (routine x 2)     Status: None (Preliminary result)   Collection Time: 08/17/20  2:50 PM   Specimen: BLOOD  Result Value Ref Range Status   Specimen Description BLOOD RIGHT ANTECUBITAL  Final   Special Requests   Final    BOTTLES DRAWN AEROBIC AND ANAEROBIC Blood Culture results may not be optimal due to an inadequate volume of blood received in culture bottles   Culture   Final    NO GROWTH 2 DAYS Performed at [facility], [address], [place], [place] [address]    Report Status PENDING  Incomplete     Medications:    atorvastatin  40 mg Per Tube Daily   Chlorhexidine Gluconate Cloth  6 each Topical Daily   cyanocobalamin  1,000 mcg Intramuscular Daily   enoxaparin (LOVENOX) injection  40 mg Subcutaneous Q24H   lidocaine  1 patch Transdermal Q24H   Continuous Infusions:  sodium chloride 75 mL/hr at 08/20/20 0609   levETIRAcetam Stopped (08/19/20 2254)      LOS: 3 days   Marinda Elk  [facility]  08/20/2020, 6:47 AM

## 2020-08-20 NOTE — Care Management Important Message (Signed)
Important Message  Patient Details  Name: Jodi Barr MRN: [id number] Date of Birth: 29-Nov-1932   Medicare Important Message Given:  Yes     Weslee Fogg Stefan Church 08/20/2020, 3:41 PM

## 2020-08-20 NOTE — Progress Notes (Signed)
  Speech Language Pathology Treatment: Dysphagia  Patient Details Name: Jodi Barr MRN: [id number] DOB: [birth year] Today's Date: 08/20/2020 Time: 8502-7741 SLP Time Calculation (min) (ACUTE ONLY): 24 min  Assessment / Plan / Recommendation Clinical Impression  Pt with improved mentation today - remains confused, but able to follow commands and communicating.  Asking for water.  Pt demonstrated adequate ability to drink from a straw. Consumed six ounces of water rapidly with no s/s of aspiration. Consumed yogurt with functional oral manipulation, but had difficulty chewing graham crackers with residue remaining in left oral cavity.  Recommend initiating a pureed/dysphagia 1 diet for now; thin liquids; give meds whole in puree. SLP will follow for safety/diet advancement.    HPI HPI: Pt is an 87 y.o. female after being found responsive and having a witnessed (by EMS) seizure with left sided weakness and AMS. CT head negative for acute changes.  CXR 6/9: Small left effusion with underlying opacity, likely atelectasis. PMH: HTN, HLD, GERD, chronic T12 compression fracture, constipation.      SLP Plan  Continue with current plan of care       Recommendations  Diet recommendations: Dysphagia 1 (puree);Thin liquid Liquids provided via: Straw Medication Administration: Whole meds with puree Supervision: Staff to assist with self feeding;Full supervision/cueing for compensatory strategies Compensations: Minimize environmental distractions Postural Changes and/or Swallow Maneuvers: Seated upright 90 degrees                Oral Care Recommendations: Oral care BID Follow up Recommendations: Other (comment) (tba) SLP Visit Diagnosis: Dysphagia, unspecified (R13.10) Plan: Continue with current plan of care       GO              Braelyn Bordonaro L. Samson Frederic, MA CCC/SLP Acute Rehabilitation Services Office number [phone] Pager [phone]   Blenda Mounts Laurice 08/20/2020,  1:35 PM

## 2020-08-20 NOTE — Progress Notes (Signed)
SLP Cancellation Note  Patient Details Name: Jodi Barr MRN: [id number] DOB: 04-27-1932   Cancelled treatment:       Reason Eval/Treat Not Completed: Patient at procedure or test/unavailable Pt leaving unit for MRI. Pt was heard to be conversational and able to follow directions during MD evaluation, so suspect good ability to participate in clinical swallow evaluation once available.  Jodi Barr, M.S., CCC-SLP Speech-Language Pathologist Acute Rehabilitation Services Pager: [phone]  Jodi Barr 08/20/2020, 9:22 AM

## 2020-08-20 NOTE — Progress Notes (Signed)
Subjective: Continues to be more interactive. Refusing MRI.   Objective: Current vital signs: BP (!) 155/82 (BP Location: Left Arm)   Pulse 100   Temp 97.7 F (36.5 C) (Oral)   Resp 19   Wt 53 kg   SpO2 96%   BMI 20.70 kg/m  Vital signs in last 24 hours: Temp:  [97.7 F (36.5 C)-98.7 F (37.1 C)] 97.7 F (36.5 C) (06/13 1200) Pulse Rate:  [90-109] 100 (06/13 1200) Resp:  [16-20] 19 (06/13 1200) BP: (125-175)/(50-99) 155/82 (06/13 1200) SpO2:  [96 %-100 %] 96 % (06/13 0800)  Intake/Output from previous day: 06/12 0701 - 06/13 0700 In: 1892.7 [I.V.:1692.7; IV Piggyback:200] Out: 385 [Urine:385] Intake/Output this shift: No intake/output data recorded. Nutritional status:  Diet Order             DIET - DYS 1 Room service appropriate? No; Fluid consistency: Thin  Diet effective now                  HEENT: [place]/AT Neck: Right JVD noted Lungs: Respirations unlabored Ext: Warm and well perfused  Neurologic Exam: Ment: Awakens easily. Oriented to year, month and day, but had difficulty with the name of the hospital. Speech is fluent with intact comprehension of basic commands. Was not able to add 5+3 in the context of poor effort. Appears fatigued. Refusing MRI even after benefits for stroke rule out described to her.  CN: PERRL. Temporal visual fields intact with no extinction to DSS. EOMI. Face symmetric. Phonation intact.   Motor: Poor effort. Max strength elicited is 4/5 in upper extremities and 3/5 in lower extremities. No asymmetry noted.  Sensory: Intact to temp sensation x 4 Cerebellar: No ataxia noted.  Reflexes: 1+ bilateral brachioradialis and biceps. Trace patellar reflexes.  Gait: Deferred  Lab Results: Results for orders placed or performed during the hospital encounter of 08/17/20 (from the past 48 hour(s))  CBC with Differential/Platelet     Status: Abnormal   Collection Time: 08/19/20  8:28 AM  Result Value Ref Range   WBC 14.0 (H) 4.0 - 10.5 K/uL    RBC 3.87 3.87 - 5.11 MIL/uL   Hemoglobin 12.3 12.0 - 15.0 g/dL   HCT 16.136.9 09.636.0 - 04.546.0 %   MCV 95.3 80.0 - 100.0 fL   MCH 31.8 26.0 - 34.0 pg   MCHC 33.3 30.0 - 36.0 g/dL   RDW 40.913.2 81.111.5 - 91.415.5 %   Platelets 284 150 - 400 K/uL   nRBC 0.0 0.0 - 0.2 %   Neutrophils Relative % 83 %   Neutro Abs 11.7 (H) 1.7 - 7.7 K/uL   Lymphocytes Relative 5 %   Lymphs Abs 0.6 (L) 0.7 - 4.0 K/uL   Monocytes Relative 11 %   Monocytes Absolute 1.6 (H) 0.1 - 1.0 K/uL   Eosinophils Relative 0 %   Eosinophils Absolute 0.0 0.0 - 0.5 K/uL   Basophils Relative 0 %   Basophils Absolute 0.0 0.0 - 0.1 K/uL   Immature Granulocytes 1 %   Abs Immature Granulocytes 0.11 (H) 0.00 - 0.07 K/uL    Comment: Performed at [facility], [address] [place], [place] [address]  Basic metabolic panel     Status: Abnormal   Collection Time: 08/19/20  8:28 AM  Result Value Ref Range   Sodium 131 (L) 135 - 145 mmol/L   Potassium 3.7 3.5 - 5.1 mmol/L   Chloride 102 98 - 111 mmol/L   CO2 15 (L) 22 -  32 mmol/L   Glucose, Bld 99 70 - 99 mg/dL    Comment: Glucose reference range applies only to samples taken after fasting for at least 8 hours.   BUN 23 8 - 23 mg/dL   Creatinine, Ser 1.63 0.44 - 1.00 mg/dL   Calcium 8.5 (L) 8.9 - 10.3 mg/dL   GFR, Estimated 56 (L) >60 mL/min    Comment: (NOTE) Calculated using the CKD-EPI Creatinine Equation (2021)    Anion gap 14 5 - 15    Comment: Performed at [facility], [address] [place], [place] [address]  Lactic acid, plasma     Status: None   Collection Time: 08/19/20  8:28 AM  Result Value Ref Range   Lactic Acid, Venous 1.9 0.5 - 1.9 mmol/L    Comment: Performed at [facility], [address], [place], [place] [address]    Recent Results (from the past 240 hour(s))  Resp Panel by RT-PCR (Flu A&B, Covid) Nasopharyngeal Swab     Status: None   Collection Time: 08/17/20 10:22 AM   Specimen: Nasopharyngeal Swab; Nasopharyngeal(NP) swabs in vial  transport medium  Result Value Ref Range Status   SARS Coronavirus 2 by RT PCR NEGATIVE NEGATIVE Final    Comment: (NOTE) SARS-CoV-2 target nucleic acids are NOT DETECTED.  The SARS-CoV-2 RNA is generally detectable in upper respiratory specimens during the acute phase of infection. The lowest concentration of SARS-CoV-2 viral copies this assay can detect is 138 copies/mL. A negative result does not preclude SARS-Cov-2 infection and should not be used as the sole basis for treatment or other patient management decisions. A negative result may occur with  improper specimen collection/handling, submission of specimen other than nasopharyngeal swab, presence of viral mutation(s) within the areas targeted by this assay, and inadequate number of viral copies(<138 copies/mL). A negative result must be combined with clinical observations, patient history, and epidemiological information. The expected result is Negative.  Fact Sheet for Patients:  BloggerCourse.com  Fact Sheet for Healthcare Providers:  SeriousBroker.it  This test is no t yet approved or cleared by the [place] FDA and  has been authorized for detection and/or diagnosis of SARS-CoV-2 by FDA under an Emergency Use Authorization (EUA). This EUA will remain  in effect (meaning this test can be used) for the duration of the COVID-19 declaration under Section 564(b)(1) of the Act, 21 U.S.C.section 360bbb-3(b)(1), unless the authorization is terminated  or revoked sooner.       Influenza A by PCR NEGATIVE NEGATIVE Final   Influenza B by PCR NEGATIVE NEGATIVE Final    Comment: (NOTE) The Xpert Xpress SARS-CoV-2/FLU/RSV plus assay is intended as an aid in the diagnosis of influenza from Nasopharyngeal swab specimens and should not be used as a sole basis for treatment. Nasal washings and aspirates are unacceptable for Xpert Xpress SARS-CoV-2/FLU/RSV testing.  Fact  Sheet for Patients: BloggerCourse.com  Fact Sheet for Healthcare Providers: SeriousBroker.it  This test is not yet approved or cleared by the [place] FDA and has been authorized for detection and/or diagnosis of SARS-CoV-2 by FDA under an Emergency Use Authorization (EUA). This EUA will remain in effect (meaning this test can be used) for the duration of the COVID-19 declaration under Section 564(b)(1) of the Act, 21 U.S.C. section 360bbb-3(b)(1), unless the authorization is terminated or revoked.  Performed at [facility], [address] [place], [place] [address]   Culture, blood (routine x 2)     Status: None (  Preliminary result)   Collection Time: 08/17/20  1:20 PM   Specimen: BLOOD  Result Value Ref Range Status   Specimen Description BLOOD RIGHT ANTECUBITAL  Final   Special Requests   Final    BOTTLES DRAWN AEROBIC AND ANAEROBIC Blood Culture results may not be optimal due to an inadequate volume of blood received in culture bottles   Culture   Final    NO GROWTH 3 DAYS Performed at [facility], [address] [place], [place] [address]    Report Status PENDING  Incomplete  Culture, blood (routine x 2)     Status: None (Preliminary result)   Collection Time: 08/17/20  2:50 PM   Specimen: BLOOD  Result Value Ref Range Status   Specimen Description BLOOD RIGHT ANTECUBITAL  Final   Special Requests   Final    BOTTLES DRAWN AEROBIC AND ANAEROBIC Blood Culture results may not be optimal due to an inadequate volume of blood received in culture bottles   Culture   Final    NO GROWTH 3 DAYS Performed at [facility], [address], [place], [place] [address]    Report Status PENDING  Incomplete    Lipid Panel Recent Labs    08/18/20 0148  CHOL 220*  TRIG 69  HDL 63  CHOLHDL 3.5  VLDL 14  LDLCALC 062*    Studies/Results: DG Chest 1 View  Result Date: 08/20/2020 CLINICAL DATA:   Follow-up abnormal chest x-ray EXAM: CHEST  1 VIEW COMPARISON:  08/17/2020 FINDINGS: Progression of left lower lobe atelectasis. Small left effusion unchanged. Progression of mild right lower lobe airspace disease and small right effusion. Negative for heart failure or edema. IMPRESSION: Progression of bibasilar atelectasis left greater than right. Progression of small right effusion. No change left effusion. Electronically Signed   By: Marlan Palau M.D.   On: 08/20/2020 10:45   DG Lumbar Spine 2-3 Views  Result Date: 08/20/2020 CLINICAL DATA:  Lumbar spine pain EXAM: LUMBAR SPINE - 2-3 VIEW COMPARISON:  CT scan of the abdomen and pelvis 11/25/2019 FINDINGS: Compression fractures present at T11, T12 and L1. The T12 compression fracture appears chronic compared to prior images. The L1 compression fracture is new compared to September of 2021. The remaining lumbar vertebra remain unchanged. There is exaggerated focal kyphosis at the site of 3 contiguous fractures. L5-S1 facet arthropathy. IMPRESSION: 1. Age indeterminate L1 compression fracture is new compared to prior imaging from November 25, 2019. The fracture has occurred at some point in the interim. 2. Stable chronic T12 compression fracture. 3. Age indeterminate T11 compression fracture, favor chronic. 4. Exaggerated focal kyphosis at the site of compression fractures. 5. L5-S1 facet arthropathy. Electronically Signed   By: Malachy Moan M.D.   On: 08/20/2020 10:48   CT HEAD WO CONTRAST  Result Date: 08/18/2020 CLINICAL DATA:  Seizure EXAM: CT HEAD WITHOUT CONTRAST TECHNIQUE: Contiguous axial images were obtained from the base of the skull through the vertex without intravenous contrast. COMPARISON:  August 17, 2020 FINDINGS: Evaluation is limited secondary to streak artifact from overlying EEG leads. Brain: No evidence of acute infarction, hemorrhage, hydrocephalus, extra-axial collection or mass lesion/mass effect. Periventricular white matter  hypodensities consistent with sequela of chronic microvascular ischemic disease. General parenchymal volume loss. Vascular: Vascular calcifications of the carotid siphons. Skull: No acute fracture. Sinuses/Orbits: No acute finding. Other: None. IMPRESSION: No acute intracranial abnormality. Electronically Signed   By: Meda Klinefelter MD   On: 08/18/2020 20:31  Medications: Scheduled:  atorvastatin  40 mg Per Tube Daily   Chlorhexidine Gluconate Cloth  6 each Topical Daily   enoxaparin (LOVENOX) injection  40 mg Subcutaneous Q24H   lidocaine  1 patch Transdermal Q24H   metoprolol tartrate  25 mg Oral BID   Continuous:  sodium chloride 75 mL/hr at 08/20/20 0609   levETIRAcetam 500 mg (08/20/20 1141)   Assessment: 87 yo female who presented as a code stroke 08/17/20 with witnessed GTC seizure en route.  - CT head 6/10 showed no acute finding. Atrophy and chronic small-vessel ischemic changes were noted. Atherosclerotic calcification of the major vessels at the base of the brain was seen - 1st EEG did show GPDs with triphasic morphology up to 1 Hz but no seizures or epileptiform discharges, but she had been given a 2 gm load of Keppra before study. Overnight EEG showed improvement over 08/18/20. without seizure activity but continued to display moderately diffuse encephalopathy. LTM EEG report from yesterday documents findings suggestive of moderate diffuse encephalopathy, nonspecific etiology  No seizures or definite epileptiform discharges were seen throughout the recording. - Initially was significantly hyponatremic with Na of 121. This is the most likely etiology for her seizure. Her hyponatremia has been corrected to 131 as of the most recent level from yesterday. - From a mental status standpoint, she continues to improve. She is mildly disoriented but with fluent speech and is able to adequately relay her reasons for not wanting an MRI (claustrophobia and anxiety). Suspect that she had a  prolonged post-ictal state, which is now almost fully resolved.  - Given that EMS also reported L sided weakness en route prior to seizure, as well as her new onset a-fib, stroke is on the DDx. However, today she has no lateralized weakness. Plan was for MRI to r/o stroke, but patient is adamantly refusing, even when offered pain relieving medication or benzodiazepine anxiolytic.    Recommendations: -Continue to correct metabolic derangement as you are doing.  - New onset a fib - mgmt per primary team. Initial plan was not to anticoagulate for this unless repeat imaging with MRI or CT were positive for subacute evolving acute infarct. However, given lack of focality on today's exam, a large infarction is essentially off the DDx and patient can safely be started on anticoagulation, provided that she is not a significant falls risk - Continue seizure precautions. - Given that her seizure was most likely provoked by hyponatremia, can discontinue Keppra  - Back pain. Of note, x-ray reveals age indeterminate L1 compression fracture, which is new compared to prior imaging from November 25, 2019. Management per primary team.  - Neurology will sign off. Please call if there are additional questions.    LOS: 3 days   @Electronically  signed: Dr. 08/20/2020  3:26 PM

## 2020-08-20 NOTE — Evaluation (Signed)
Speech Language Pathology Evaluation Patient Details Name: EMEREE MAHLER MRN: [id number] DOB: [birth year] Today's Date: 08/20/2020 Time: 0488-8916 SLP Time Calculation (min) (ACUTE ONLY): 14 min  Problem List:  Patient Active Problem List   Diagnosis Date Noted   Seizure (HCC) 08/17/2020   Acute left-sided weakness 08/17/2020   Altered mental status 08/17/2020   Leukocytosis 08/17/2020   Hyponatremia 08/17/2020   Enlarged thyroid 08/17/2020   Low back pain 08/17/2020   Pain in joint of right hip 11/05/2017   Acute non-recurrent maxillary sinusitis 05/01/2016   ABNORMAL FINDINGS GI TRACT 08/13/2009   Hyperlipidemia 08/07/2009   Essential hypertension 08/07/2009   HEARTBURN 08/07/2009   ABDOMINAL BLOATING 08/07/2009   RLQ PAIN 08/07/2009   ABDOMINAL PAIN, LEFT LOWER QUADRANT 08/07/2009   PERSONAL HX COLONIC POLYPS 08/07/2009   Past Medical History:  Past Medical History:  Diagnosis Date   Allergies    Arthritis    Hypertension    Past Surgical History: No past surgical history on file. HPI:  Pt is an 87 y.o. female after being found responsive and having a witnessed (by EMS) seizure with left sided weakness and AMS. CT head negative for acute changes.  CXR 6/9: Small left effusion with underlying opacity, likely atelectasis. PMH: HTN, HLD, GERD, chronic T12 compression fracture, constipation.   Assessment / Plan / Recommendation Clinical Impression  Pt more alert today - demonstrates functional expressive and receptive language. Speech is clear and fluent and without dysarthria.  Confusion persists. She has a left gaze preference but could be cued to look midline and to the left.  Demonstrated orientation to self but fluctuating orientation to elements of time/place/situation.  Attention and awareness impaired. Poor short-term recall. Pt will benefit from ongoing SLP in the acute care setting. MRI is pending today.    SLP Assessment  SLP Recommendation/Assessment: Patient  needs continued Speech Lanaguage Pathology Services SLP Visit Diagnosis: Cognitive communication deficit (R41.841)    Follow Up Recommendations  Other (comment) (tba)    Frequency and Duration min 2x/week  2 weeks      SLP Evaluation Cognition  Overall Cognitive Status: Impaired/Different from baseline Arousal/Alertness: Awake/alert Orientation Level: Oriented to person;Disoriented to place;Disoriented to time Attention: Sustained Sustained Attention: Impaired Sustained Attention Impairment: Verbal basic Memory: Impaired Memory Impairment: Storage deficit Awareness: Impaired Problem Solving: Impaired Problem Solving Impairment: Verbal basic Safety/Judgment: Appears intact       Comprehension  Auditory Comprehension Overall Auditory Comprehension: Appears within functional limits for tasks assessed Reading Comprehension Reading Status: Not tested    Expression Expression Primary Mode of Expression: Verbal Verbal Expression Overall Verbal Expression: Appears within functional limits for tasks assessed Written Expression Dominant Hand: Left   Oral / Motor  Oral Motor/Sensory Function Overall Oral Motor/Sensory Function: Within functional limits Motor Speech Overall Motor Speech: Appears within functional limits for tasks assessed   GO                    Blenda Mounts Laurice 08/20/2020, 1:50 PM Marchelle Folks L. Samson Frederic, MA CCC/SLP Acute Rehabilitation Services Office number [phone] Pager [phone]

## 2020-08-21 DIAGNOSIS — G9340 Encephalopathy, unspecified: Secondary | ICD-10-CM

## 2020-08-21 MED ORDER — IRBESARTAN 300 MG PO TABS: 300.0000 mg | ORAL_TABLET | Freq: Every day | ORAL | Status: DC

## 2020-08-21 MED ORDER — IRBESARTAN 300 MG PO TABS
300.0000 mg | ORAL_TABLET | Freq: Every day | ORAL | Status: DC
  Administered 2020-08-21 – 2020-08-29 (×9): 300 mg via ORAL
  Filled 2020-08-21 (×9): qty 1

## 2020-08-21 MED ORDER — HYDROCHLOROTHIAZIDE 12.5 MG PO CAPS
12.5000 mg | ORAL_CAPSULE | Freq: Every day | ORAL | Status: DC
  Administered 2020-08-21 – 2020-08-24 (×4): 12.5 mg via ORAL
  Filled 2020-08-21 (×4): qty 1

## 2020-08-21 MED ORDER — LISINOPRIL 10 MG PO TABS: 10.0000 mg | ORAL_TABLET | Freq: Every day | ORAL | Status: DC

## 2020-08-21 NOTE — [place] FL2 (Signed)
[place] MEDICAID FL2 LEVEL OF CARE SCREENING TOOL     IDENTIFICATION  Patient Name: Jodi Barr Birthdate: [birth year] Sex: female Admission Date (Current Location): 08/17/2020  [facility] and [place] Number:  Producer, television/film/video and Address:  [facility], [address], [place], [place] [address]      Provider Number: [id number]  Attending Physician Name and Address:  Marinda Elk, MD  Relative Name and Phone Number:       Current Level of Care: Hospital Recommended Level of Care: Skilled Nursing Facility Prior Approval Number:    Date Approved/Denied:   PASRR Number: [id number]  Discharge Plan: SNF    Current Diagnoses: Patient Active Problem List   Diagnosis Date Noted   Seizure (HCC) 08/17/2020   Acute left-sided weakness 08/17/2020   Altered mental status 08/17/2020   Leukocytosis 08/17/2020   Hyponatremia 08/17/2020   Enlarged thyroid 08/17/2020   Low back pain 08/17/2020   Pain in joint of right hip 11/05/2017   Acute non-recurrent maxillary sinusitis 05/01/2016   ABNORMAL FINDINGS GI TRACT 08/13/2009   Hyperlipidemia 08/07/2009   Essential hypertension 08/07/2009   HEARTBURN 08/07/2009   ABDOMINAL BLOATING 08/07/2009   RLQ PAIN 08/07/2009   ABDOMINAL PAIN, LEFT LOWER QUADRANT 08/07/2009   PERSONAL HX COLONIC POLYPS 08/07/2009    Orientation RESPIRATION BLADDER Height & Weight     Self, Time, Place, Situation  Normal Indwelling catheter Weight: 116 lb 13.5 oz (53 kg) Height:     BEHAVIORAL SYMPTOMS/MOOD NEUROLOGICAL BOWEL NUTRITION STATUS      Continent Diet (see DC summary)  AMBULATORY STATUS COMMUNICATION OF NEEDS Skin   Extensive Assist Verbally Normal                       Personal Care Assistance Level of Assistance  Bathing, Feeding, Dressing Bathing Assistance: Maximum assistance Feeding assistance: Limited assistance Dressing Assistance: Maximum assistance     Functional Limitations Info   Speech     Speech Info: Impaired (dysarthria)    SPECIAL CARE FACTORS FREQUENCY  PT (By licensed PT), OT (By licensed OT)     PT Frequency: 5x/wk OT Frequency: 5x/wk            Contractures Contractures Info: Not present    Additional Factors Info  Code Status, Allergies Code Status Info: DNR Allergies Info: Aspirin, Diclofenac Sodium, Diltiazem Hcl, Omeprazole, Penicillins, Prednisone, Tramadol Hcl           Current Medications (08/21/2020):  This is the current hospital active medication list Current Facility-Administered Medications  Medication Dose Route Frequency Provider Last Rate Last Admin   0.9 %  sodium chloride infusion   Intravenous Continuous Marinda Elk, MD 10 mL/hr at 08/21/20 0830 Rate Change at 08/21/20 0830   acetaminophen (TYLENOL) tablet 1,000 mg  1,000 mg Oral Q8H McIlquham, Amil Amen B, NP   1,000 mg at 08/21/20 1355   atorvastatin (LIPITOR) tablet 40 mg  40 mg Per Tube Daily Kathlen Mody, MD   40 mg at 08/21/20 0914   Chlorhexidine Gluconate Cloth 2 % PADS 6 each  6 each Topical Daily Kathlen Mody, MD   6 each at 08/21/20 1000   enoxaparin (LOVENOX) injection 40 mg  40 mg Subcutaneous Q24H Orland Mustard, MD   40 mg at 08/20/20 2150   hydrALAZINE (APRESOLINE) injection 10 mg  10 mg Intravenous Q6H PRN Pokhrel, Laxman, MD   10 mg at 08/20/20 0547   hydrochlorothiazide (MICROZIDE) capsule  12.5 mg  12.5 mg Oral Daily Marinda Elk, MD   12.5 mg at 08/21/20 0913   irbesartan (AVAPRO) tablet 300 mg  300 mg Oral Daily Marinda Elk, MD   300 mg at 08/21/20 0913   lidocaine (LIDODERM) 5 % 1 patch  1 patch Transdermal Q24H John Giovanni, MD   1 patch at 08/21/20 0616   metoprolol tartrate (LOPRESSOR) tablet 25 mg  25 mg Oral BID Marinda Elk, MD   25 mg at 08/21/20 0913   QUEtiapine (SEROQUEL) tablet 25 mg  25 mg Oral QHS PRN Marinda Elk, MD   25 mg at 08/20/20 2150     Discharge Medications: Please see discharge  summary for a list of discharge medications.  Relevant Imaging Results:  Relevant Lab Results:   Additional Information SS#: [id number]  Baldemar Lenis, LCSW

## 2020-08-21 NOTE — TOC Initial Note (Signed)
Transition of Care [facility]) - Initial/Assessment Note    Patient Details  Name: Jodi Barr MRN: [id number] Date of Birth: [birth year]  Transition of Care [facility]) CM/SW Contact:    Baldemar Lenis, LCSW Phone Number: 08/21/2020, 3:11 PM  Clinical Narrative:       CSW spoke with patient's Marlene Bast, to discuss recommendation for SNF. Lyle in agreement with SNF, no preference on facility choice. CSW to fax out referral and will follow up on bed offers.             Expected Discharge Plan: Skilled Nursing Facility Barriers to Discharge: Continued Medical Work up, English as a second language teacher   Patient Goals and CMS Choice Patient states their goals for this hospitalization and ongoing recovery are:: to get back home CMS Medicare.gov Compare Post Acute Care list provided to:: Patient Represenative (must comment) Choice offered to / list presented to : [facility] POA / Guardian  Expected Discharge Plan and Services Expected Discharge Plan: Skilled Nursing Facility     Post Acute Care Choice: Skilled Nursing Facility Living arrangements for the past 2 months: Single Family Home                                      Prior Living Arrangements/Services Living arrangements for the past 2 months: Single Family Home Lives with:: Self Patient language and need for interpreter reviewed:: No Do you feel safe going back to the place where you live?: Yes      Need for Family Participation in Patient Care: Yes (Comment) Care giver support system in place?: No (comment)   Criminal Activity/Legal Involvement Pertinent to Current Situation/Hospitalization: No - Comment as needed  Activities of Daily Living      Permission Sought/Granted Permission sought to share information with : Facility Medical sales representative, Family Supports Permission granted to share information with : Yes, Verbal Permission Granted  Share Information with NAME: [name]  Permission granted to share info w AGENCY:  SNF  Permission granted to share info w Relationship: HCPOA     Emotional Assessment   Attitude/Demeanor/Rapport: Unable to Assess Affect (typically observed): Unable to Assess Orientation: : Oriented to Self Alcohol / Substance Use: Not Applicable Psych Involvement: No (comment)  Admission diagnosis:  Lumbar pain [M54.50] Seizure (HCC) [R56.9] Altered mental status, unspecified altered mental status type [R41.82] Unwitnessed fall [R29.6] Patient Active Problem List   Diagnosis Date Noted   Seizure (HCC) 08/17/2020   Acute left-sided weakness 08/17/2020   Altered mental status 08/17/2020   Leukocytosis 08/17/2020   Hyponatremia 08/17/2020   Enlarged thyroid 08/17/2020   Low back pain 08/17/2020   Pain in joint of right hip 11/05/2017   Acute non-recurrent maxillary sinusitis 05/01/2016   ABNORMAL FINDINGS GI TRACT 08/13/2009   Hyperlipidemia 08/07/2009   Essential hypertension 08/07/2009   HEARTBURN 08/07/2009   ABDOMINAL BLOATING 08/07/2009   RLQ PAIN 08/07/2009   ABDOMINAL PAIN, LEFT LOWER QUADRANT 08/07/2009   PERSONAL HX COLONIC POLYPS 08/07/2009   PCP:  Geoffry Paradise, MD Pharmacy:   [id number] - [place], [place] - [address] [place] [address] Phone: [phone] Fax: [phone]     Social Determinants of Health (SDOH) Interventions    Readmission Risk Interventions No flowsheet data found.

## 2020-08-21 NOTE — Plan of Care (Signed)
  Problem: Education: Goal: Knowledge of General Education information will improve Description: Including pain rating scale, medication(s)/side effects and non-pharmacologic comfort measures Outcome: Not Progressing   Problem: Health Behavior/Discharge Planning: Goal: Ability to manage health-related needs will improve Outcome: Not Progressing   Problem: Clinical Measurements: Goal: Ability to maintain clinical measurements within normal limits will improve Outcome: Not Progressing Goal: Will remain free from infection Outcome: Not Progressing Goal: Diagnostic test results will improve Outcome: Not Progressing Goal: Respiratory complications will improve Outcome: Not Progressing Goal: Cardiovascular complication will be avoided Outcome: Not Progressing   Problem: Nutrition: Goal: Adequate nutrition will be maintained Outcome: Not Progressing   Problem: Coping: Goal: Level of anxiety will decrease Outcome: Not Progressing   Problem: Elimination: Goal: Will not experience complications related to bowel motility Outcome: Not Progressing Goal: Will not experience complications related to urinary retention Outcome: Not Progressing   Problem: Safety: Goal: Ability to remain free from injury will improve Outcome: Not Progressing   Problem: Skin Integrity: Goal: Risk for impaired skin integrity will decrease Outcome: Not Progressing   Problem: Education: Goal: Expressions of having a comfortable level of knowledge regarding the disease process will increase Outcome: Not Progressing   Problem: Coping: Goal: Ability to adjust to condition or change in health will improve Outcome: Not Progressing Goal: Ability to identify appropriate support needs will improve Outcome: Not Progressing   Problem: Health Behavior/Discharge Planning: Goal: Compliance with prescribed medication regimen will improve Outcome: Not Progressing   Problem: Medication: Goal: Risk for medication  side effects will decrease Outcome: Not Progressing

## 2020-08-21 NOTE — Evaluation (Signed)
Physical Therapy Evaluation Patient Details Name: Jodi Barr MRN: [id number] DOB: 1933/01/23 Today's Date: 08/21/2020   History of Present Illness  Pt is an 87 y.o. female after being found responsive and having a witnessed (by EMS) seizure with left sided weakness and AMS. CT and MRI negative for acute changes.  CXR 6/9: Small left effusion with underlying opacity, likely atelectasis. PMH: HTN, HLD, GERD, chronic T12 compression fracture, constipation.  Clinical Impression  Pt admitted with above diagnosis. Pt checked on several times in AM and asleep each time without waking to name. On eval, able to arouse pt with sternal rub but pt kept eyes closed most of session, even in sitting and standing. Max A needed to come to EOB and pt c/o back pain and L thigh pain. Pt achieved partial stand with max A and increased time with heavy posterior lean against bed. Pt wanted to attempt ambulation but was unable to stand again to do so. Neighbor who is HCPOA arrived during eval and confirms that pt normally independent in her home and drives, cooks, etc. Pt's bed placed in chair position end of session for neighbor to help her eat some lunch.  Pt currently with functional limitations due to the deficits listed below (see PT Problem List). Pt will benefit from skilled PT to increase their independence and safety with mobility to allow discharge to the venue listed below.       Follow Up Recommendations SNF;Supervision/Assistance - 24 hour    Equipment Recommendations  None recommended by PT    Recommendations for Other Services       Precautions / Restrictions Precautions Precautions: Fall Restrictions Weight Bearing Restrictions: No      Mobility  Bed Mobility Overal bed mobility: Needs Assistance Bed Mobility: Supine to Sit;Sit to Supine     Supine to sit: Max assist Sit to supine: Total assist   General bed mobility comments: pt groaning with log roll to L (to protech T12  compression fx), max A for LE's off bed and elevation of trunk into sitting. Tot A to return to supine    Transfers Overall transfer level: Needs assistance Equipment used: Rolling walker (2 wheeled);None Transfers: Sit to/from Stand Sit to Stand: Max assist         General transfer comment: max A for power up to stand and pt maintained B knees in flexion with heavy lean against bed. Pt wanted to try to walk so RW placed in front of pt but she could not get to standing again to attempt stepping  Ambulation/Gait             General Gait Details: unable  Stairs            Wheelchair Mobility    Modified Rankin (Stroke Patients Only)       Balance Overall balance assessment: Needs assistance Sitting-balance support: Bilateral upper extremity supported;Feet supported Sitting balance-Leahy Scale: Poor Sitting balance - Comments: close guarding needed Postural control: Posterior lean Standing balance support: Bilateral upper extremity supported;During functional activity Standing balance-Leahy Scale: Zero Standing balance comment: max A to maintain partial stand                             Pertinent Vitals/Pain Pain Assessment: Faces Faces Pain Scale: Hurts even more Pain Location: back and L thigh Pain Descriptors / Indicators: Aching;Grimacing;Guarding;Sore Pain Intervention(s): Limited activity within patient's tolerance;Monitored during session    Home Living Family/patient expects  to be discharged to:: Private residence Living Arrangements: Alone Available Help at Discharge: Neighbor;Available PRN/intermittently Type of Home: House Home Access: Stairs to enter   [employer] of Steps: pt could not state Home Layout: One level Home Equipment: Walker - 2 wheels;Cane - single point Additional Comments: pt very lethargic, neighbor confirmed some info    Prior Function Level of Independence: Independent         Comments: pt was  independent in her home, driving, cooking, neighbors help out when she needs it     Hand Dominance   Dominant Hand: Left    Extremity/Trunk Assessment   Upper Extremity Assessment Upper Extremity Assessment: Generalized weakness;Defer to OT evaluation    Lower Extremity Assessment Lower Extremity Assessment: Generalized weakness;RLE deficits/detail RLE Deficits / Details: RLE weaker than L, hip flex <3/5    Cervical / Trunk Assessment Cervical / Trunk Assessment: Kyphotic  Communication   Communication: No difficulties  Cognition Arousal/Alertness: Lethargic Behavior During Therapy: Flat affect Overall Cognitive Status: Difficult to assess                                 General Comments: difficult to assess due to lethargy, followed simple, one step commands      General Comments General comments (skin integrity, edema, etc.): neighbor (who is her HCPOA) arrived partway through session and agreed that pt is normally independent    Exercises     Assessment/Plan    PT Assessment Patient needs continued PT services  PT Problem List Decreased strength;Decreased range of motion;Decreased activity tolerance;Decreased balance;Decreased mobility;Decreased cognition;Decreased knowledge of use of DME;Decreased safety awareness;Decreased knowledge of precautions;Pain       PT Treatment Interventions DME instruction;Gait training;Functional mobility training;Therapeutic activities;Therapeutic exercise;Balance training;Neuromuscular re-education;Cognitive remediation;Patient/family education    PT Goals (Current goals can be found in the Care Plan section)  Acute Rehab PT Goals Patient Stated Goal: walk PT Goal Formulation: With patient Time For Goal Achievement: 09/04/20 Potential to Achieve Goals: Fair    Frequency Min 3X/week   Barriers to discharge Decreased caregiver support      Co-evaluation               AM-PAC PT "6 Clicks" Mobility   Outcome Measure Help needed turning from your back to your side while in a flat bed without using bedrails?: A Lot Help needed moving from lying on your back to sitting on the side of a flat bed without using bedrails?: A Lot Help needed moving to and from a bed to a chair (including a wheelchair)?: Total Help needed standing up from a chair using your arms (e.g., wheelchair or bedside chair)?: Total Help needed to walk in hospital room?: Total Help needed climbing 3-5 steps with a railing? : Total 6 Click Score: 8    End of Session Equipment Utilized During Treatment: Gait belt Activity Tolerance: Patient limited by lethargy;Patient limited by fatigue Patient left: in bed;with call bell/phone within reach;with bed alarm set;with family/visitor present Nurse Communication: Mobility status PT Visit Diagnosis: Muscle weakness (generalized) (M62.81);Difficulty in walking, not elsewhere classified (R26.2);Pain Pain - part of body:  (back)    Time: 9767-3419 PT Time Calculation (min) (ACUTE ONLY): 24 min   Charges:   PT Evaluation $PT Eval Moderate Complexity: 1 Mod PT Treatments $Therapeutic Activity: 8-22 mins        Lyanne Co, PT  Acute Rehab Services  Pager [phone] Office [phone]  Abbigael Detlefsen L Rogelio Winbush 08/21/2020, 1:58 PM

## 2020-08-21 NOTE — Progress Notes (Addendum)
This chaplain responded to PMT consult for prayer.  The Pt. is sleeping and did not wake up to the call of her name.  The chaplain will F/U with spiritual care visit.    **[id number]  This chaplain returned to the Pt. bedside. The Pt. HCPOA/neighbor-Lyle is visiting the Pt. The Pt. is awake and offers appropriate, but weak responses to [facility] and the chaplain. The chaplain and the Pt. decide on a F/U visit on Wednesday.  The Pt. accepted the chaplain's invitation for prayer.

## 2020-08-21 NOTE — Progress Notes (Signed)
[facility] PROGRESS NOTE    Progress Note  Jodi LeaJeanene R Matthes  ZOX:[id number] DOB: 1933-01-24 DOA: 08/17/2020 PCP: Geoffry ParadiseAronson, Richard, MD     Brief Narrative:   Jodi Barr is an 87 y.o. female past medical history of essential hypertension hyperlipidemia chronic T12 compression, constipation comes into the hospital she was found to be unresponsive.  Patient was having back pain and started on muscle relaxant as an outpatient.  In the ED was found to have left-sided weakness and weakness effusion started on Keppra EEG was done that showed encephalopathy CT spine normal, CT angio of the head and neck no acute findings, CT of the head showed no acute intracranial abnormalities.    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 EEG showed showed no signs of seizures.   MRI of the brain showed PRESS.  We will go ahead and start her on Benicar and hydrochlorothiazide continue hydralazine IV as needed. Cont on Keppra. Appreciate neurology's assistance.  Hypovolemic hyponatremia: Probably contributing to seizures resolved with IV fluids.  We will go ahead and KVO IV fluids.  Leukocytosis: With a T-max of 98.3 no clear source of infection question stress margination in the setting of seizures for blood cell trending down off antibiotics culture data has remained negative.  Essential hypertension: Mildly elevated, MRI ruled out a stroke we will go ahead and resume home medications.  Hyperlipidemia: Continue statins.  Hypomagnesemia: Repleted try to keep greater than 2.  Lactic acidosis:  in the setting of seizures and dehydration now resolved.  History of T12 compression fracture: Avoid narcotics we will get physical therapy on board.     DVT prophylaxis: lovenox Family Communication:none Status is: Inpatient  Remains inpatient appropriate because:Hemodynamically unstable  Dispo: The  patient is from: Home              Anticipated d/c is to: SNF              Patient currently is not medically stable to d/c.   Difficult to place patient No        Code Status:     Code Status Orders  (From admission, onward)           Start     Ordered   08/17/20 1338  Full code  Continuous        08/17/20 1342           Code Status History     This patient has a current code status but no historical code status.         IV Access:   Peripheral IV   Procedures and diagnostic studies:   DG Chest 1 View  Result Date: 08/20/2020 CLINICAL DATA:  Follow-up abnormal chest x-ray EXAM: CHEST  1 VIEW COMPARISON:  08/17/2020 FINDINGS: Progression of left lower lobe atelectasis. Small left effusion unchanged. Progression of mild right lower lobe airspace disease and small right effusion. Negative for heart failure or edema. IMPRESSION: Progression of bibasilar atelectasis left greater than right. Progression of small right effusion. No change left effusion. Electronically Signed   By: Marlan Palauharles  Clark M.D.   On: 08/20/2020 10:45   DG Lumbar Spine 2-3 Views  Result Date: 08/20/2020 CLINICAL DATA:  Lumbar spine pain EXAM: LUMBAR SPINE - 2-3 VIEW COMPARISON:  CT scan of the abdomen and pelvis 11/25/2019 FINDINGS: Compression fractures present at T11, T12 and L1. The T12 compression fracture appears chronic compared to prior images.  The L1 compression fracture is new compared to September of 2021. The remaining lumbar vertebra remain unchanged. There is exaggerated focal kyphosis at the site of 3 contiguous fractures. L5-S1 facet arthropathy. IMPRESSION: 1. Age indeterminate L1 compression fracture is new compared to prior imaging from November 25, 2019. The fracture has occurred at some point in the interim. 2. Stable chronic T12 compression fracture. 3. Age indeterminate T11 compression fracture, favor chronic. 4. Exaggerated focal kyphosis at the site of compression fractures.  5. L5-S1 facet arthropathy. Electronically Signed   By: Malachy Moan M.D.   On: 08/20/2020 10:48   MR BRAIN W WO CONTRAST  Result Date: 08/20/2020 CLINICAL DATA:  Seizure.  Significant hypertension on presentation. EXAM: MRI HEAD WITHOUT AND WITH CONTRAST TECHNIQUE: Multiplanar, multiecho pulse sequences of the brain and surrounding structures were obtained without and with intravenous contrast. CONTRAST:  5.9mL GADAVIST GADOBUTROL 1 MMOL/ML IV SOLN COMPARISON:  Head CT 08/18/2020 FINDINGS: Brain: There is no evidence of an acute infarct, intracranial hemorrhage, mass, midline shift, or extra-axial fluid collection. There are patchy T2 hyperintensities in the cerebral white matter bilaterally which are mild-to-moderate for age. Of note, there is relatively symmetric T2 hyperintensity in the subcortical white matter of the occipital, parietal, and posterior frontal lobes. There is mild-to-moderate cerebral atrophy. No abnormal enhancement is identified. Dedicated temporal lobe imaging is mildly motion degraded with symmetric size in grossly symmetric signal of the hippocampi. Vascular: Major intracranial vascular flow voids are preserved. Skull and upper cervical spine: Unremarkable bone marrow signal. Sinuses/Orbits: Unremarkable orbits. Scattered mild mucosal thickening in the paranasal sinuses. No significant mastoid fluid. Other: None. IMPRESSION: 1. 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 2. Background mild-to-moderate chronic small vessel ischemia in the cerebral white matter. 3. No acute infarct. Electronically Signed   By: Sebastian Ache M.D.   On: 08/20/2020 16:21     Medical Consultants:   None.   Subjective:    Jodi Lea ate her breakfast this morning still drowsy.  Objective:    Vitals:   08/20/20 2015 08/20/20 2350 08/21/20 0345 08/21/20 0735  BP:  (!) 144/72 116/70 (!) 159/80 (!) 151/80  Pulse: 100 94 90 96  Resp: 19 19 19  (!) 24  Temp: 98.3 F (36.8 C) 97.9 F (36.6 C) 98.1 F (36.7 C) 98 F (36.7 C)  TempSrc: Oral Oral Oral Oral  SpO2: 94% 94% 97% 98%  Weight:       SpO2: 98 % O2 Flow Rate (L/min): 2 L/min   Intake/Output Summary (Last 24 hours) at 08/21/2020 0819 Last data filed at 08/21/2020 08/23/2020 Gross per 24 hour  Intake 1280.71 ml  Output 750 ml  Net 530.71 ml    Filed Weights   08/17/20 0900  Weight: 53 kg    Exam: General exam: In no acute distress. Respiratory system: Good air movement and clear to auscultation. Cardiovascular system: S1 & S2 heard, RRR. No JVD. Gastrointestinal system: Abdomen is nondistended, soft and nontender.  Extremities: No pedal edema. Skin: No rashes, lesions or ulcers Psychiatry: Drowsy this morning alert and oriented to person and place.    Data Reviewed:    Labs: Basic Metabolic Panel: Recent Labs  Lab 08/17/20 0900 08/17/20 0914 08/17/20 1450 08/18/20 0148 08/19/20 0828  NA 121* 121* 128* 129* 131*  K 3.9 3.9 3.0* 3.6 3.7  CL 88* 88* 94* 92* 102  CO2 19*  --  23 23 15*  GLUCOSE 178* 176* 140* 113* 99  BUN 14 14 11 11 23   CREATININE 1.08* 0.90 0.84 0.92 0.98  CALCIUM 9.1  --  8.2* 8.7* 8.5*  MG  --   --  1.4*  --   --     GFR CrCl cannot be calculated (Unknown ideal weight.). Liver Function Tests: Recent Labs  Lab 08/17/20 0900 08/18/20 0148  AST 44* 58*  ALT 32 35  ALKPHOS 77 69  BILITOT 0.9 1.0  PROT 6.4* 5.7*  ALBUMIN 3.6 3.1*    No results for input(s): LIPASE, AMYLASE in the last 168 hours. No results for input(s): AMMONIA in the last 168 hours. Coagulation profile Recent Labs  Lab 08/17/20 0900  INR 1.0    COVID-19 Labs  No results for input(s): DDIMER, FERRITIN, LDH, CRP in the last 72 hours.  Lab Results  Component Value Date   SARSCOV2NAA NEGATIVE 08/17/2020    CBC: Recent Labs  Lab 08/17/20 0900 08/17/20 0914  08/17/20 1450 08/18/20 0148 08/19/20 0828  WBC 20.0*  --  19.0* 16.2* 14.0*  NEUTROABS 17.7*  --  16.6* 13.6* 11.7*  HGB 13.3 13.9 13.0 14.3 12.3  HCT 39.1 41.0 37.4 40.8 36.9  MCV 93.8  --  91.4 90.9 95.3  PLT 371  --  348 323 284    Cardiac Enzymes: Recent Labs  Lab 08/17/20 1450  CKTOTAL 964*    BNP (last 3 results) No results for input(s): PROBNP in the last 8760 hours. CBG: Recent Labs  Lab 08/17/20 0858  GLUCAP 193*    D-Dimer: No results for input(s): DDIMER in the last 72 hours. Hgb A1c: No results for input(s): HGBA1C in the last 72 hours. Lipid Profile: No results for input(s): CHOL, HDL, LDLCALC, TRIG, CHOLHDL, LDLDIRECT in the last 72 hours.  Thyroid function studies: No results for input(s): TSH, T4TOTAL, T3FREE, THYROIDAB in the last 72 hours.  Invalid input(s): FREET3  Anemia work up: No results for input(s): VITAMINB12, FOLATE, FERRITIN, TIBC, IRON, RETICCTPCT in the last 72 hours.  Sepsis Labs: Recent Labs  Lab 08/17/20 0900 08/17/20 1347 08/17/20 1450 08/17/20 1757 08/17/20 2206 08/18/20 0148 08/19/20 0828  WBC 20.0*  --  19.0*  --   --  16.2* 14.0*  LATICACIDVEN  --  1.7  --  2.4* 2.2*  --  1.9    Microbiology Recent Results (from the past 240 hour(s))  Resp Panel by RT-PCR (Flu A&B, Covid) Nasopharyngeal Swab     Status: None   Collection Time: 08/17/20 10:22 AM   Specimen: Nasopharyngeal Swab; Nasopharyngeal(NP) swabs in vial transport medium  Result Value Ref Range Status   SARS Coronavirus 2 by RT PCR NEGATIVE NEGATIVE Final    Comment: (NOTE) SARS-CoV-2 target nucleic acids are NOT DETECTED.  The SARS-CoV-2 RNA is generally detectable in upper respiratory specimens during the acute phase of infection. The lowest concentration of SARS-CoV-2 viral copies this assay can detect is 138 copies/mL. A negative result does not preclude SARS-Cov-2 infection and should not be used as the sole basis for treatment or other patient  management decisions. A negative result may occur with  improper specimen collection/handling, submission of specimen other than nasopharyngeal swab, presence of viral mutation(s) within the areas targeted by this assay, and inadequate number of viral copies(<138 copies/mL). A negative result must be combined with clinical observations, patient history, and epidemiological information. The expected result is Negative.  Fact Sheet for Patients:  10/17/20  Fact Sheet for Healthcare Providers:  BloggerCourse.com  This test is no t yet approved or cleared by the [place] and  has been authorized for detection and/or diagnosis of SARS-CoV-2 by FDA under an Emergency Use Authorization (EUA). This EUA will remain  in effect (meaning this test can be used) for the duration of the COVID-19 declaration under Section 564(b)(1) of the Act, 21 U.S.C.section 360bbb-3(b)(1), unless the authorization is terminated  or revoked sooner.       Influenza A by PCR NEGATIVE NEGATIVE Final   Influenza B by PCR NEGATIVE NEGATIVE Final    Comment: (NOTE) The Xpert Xpress SARS-CoV-2/FLU/RSV plus assay is intended as an aid in the diagnosis of influenza from Nasopharyngeal swab specimens and should not be used as a sole basis for treatment. Nasal washings and aspirates are unacceptable for Xpert Xpress SARS-CoV-2/FLU/RSV testing.  Fact Sheet for Patients: BloggerCourse.com  Fact Sheet for Healthcare Providers: SeriousBroker.it  This test is not yet approved or cleared by the [place] FDA and has been authorized for detection and/or diagnosis of SARS-CoV-2 by FDA under an Emergency Use Authorization (EUA). This EUA will remain in effect (meaning this test can be used) for the duration of the COVID-19 declaration under Section 564(b)(1) of the Act, 21 U.S.C. section 360bbb-3(b)(1),  unless the authorization is terminated or revoked.  Performed at [facility], [address] [place], [place] [address]   Culture, blood (routine x 2)     Status: None (Preliminary result)   Collection Time: 08/17/20  1:20 PM   Specimen: BLOOD  Result Value Ref Range Status   Specimen Description BLOOD RIGHT ANTECUBITAL  Final   Special Requests   Final    BOTTLES DRAWN AEROBIC AND ANAEROBIC Blood Culture results may not be optimal due to an inadequate volume of blood received in culture bottles   Culture   Final    NO GROWTH 4 DAYS Performed at [facility], [address], [place], [place] [address]    Report Status PENDING  Incomplete  Culture, blood (routine x 2)     Status: None (Preliminary result)   Collection Time: 08/17/20  2:50 PM   Specimen: BLOOD  Result Value Ref Range Status   Specimen Description BLOOD RIGHT ANTECUBITAL  Final   Special Requests   Final    BOTTLES DRAWN AEROBIC AND ANAEROBIC Blood Culture results may not be optimal due to an inadequate volume of blood received in culture bottles   Culture   Final    NO GROWTH 4 DAYS Performed at [facility], [address] [place], [place] [address]    Report Status PENDING  Incomplete     Medications:    acetaminophen  1,000 mg Oral Q8H   atorvastatin  40 mg Per Tube Daily   Chlorhexidine Gluconate Cloth  6 each Topical Daily   enoxaparin (LOVENOX) injection  40 mg Subcutaneous Q24H   lidocaine  1 patch Transdermal Q24H   metoprolol tartrate  25 mg Oral BID   Continuous Infusions:  sodium chloride 75 mL/hr at 08/21/20 0621   levETIRAcetam Stopped (08/20/20 2204)      LOS: 4 days   Marinda Elk  [facility]  08/21/2020, 8:19 AM

## 2020-08-21 NOTE — Progress Notes (Signed)
Daily Progress Note   Patient Name: Jodi Barr       Date: 08/21/2020 DOB: 10-18-1932  Age: 87 y.o. MRN#: [id number] Attending Physician: Marinda Elk, MD Primary Care Physician: Geoffry Paradise, MD Admit Date: 08/17/2020  Reason for Consultation/Follow-up: goals of care  Subjective: Chart reviewed - note MRI results suspicious for posterior reversible encephalopathy syndrome (PRES).   Went to bedside to see patient - she states she is "not well". She reports back pain and now neck pain. She initially seems more oriented, but later asks me to 'bring her some gatorade from the kitchen'.   Spoke with [name] and Lillia Abed by phone. Provided update that Jodi Barr remains confused. Discussed MRI results and provided education on diagnosis and prognosis of PRES. Discussed that assuming her confusion is secondary to PRES it should eventually resolve.   Discussed that PT had recommended SNF. [name] and Lillia Abed fully agree  with this disposition plan. Reviewed that the focus of SNF rehab is improvement or at least stabilization of functional status. Discussed the possibility that Jodi Barr may not return to her previous baseline. Discussed that after rehab, if she was not well enough to return home she may need to remain in the SNF for long-term care.   Also provided education and counseling at length on the philosophy and benefits of hospice care. Discussed that it offers a holistic approach to care in the setting of end-stage illness/disease, and is about supporting the patient where they are while allowing the natural course to occur. 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 Discussed that if Jodi Barr declines or does not improve  while in rehab and transitions to long-term care, then she would likely be eligible for hospice services.   While Jodi Barr is in rehab, I recommended the option for outpatient palliative to check-in periodically with patient and and continue goals of care discussions. [name] and Lillia Abed are agreeable for this referral.   I emailed Lillia Abed an electronic copy of 'hard choices' book and a MOST form.    Length of Stay: 4  Current Medications: Scheduled Meds:  . acetaminophen  1,000 mg Oral Q8H  . atorvastatin  40 mg Per Tube Daily  . Chlorhexidine Gluconate Cloth  6 each Topical  Daily  . enoxaparin (LOVENOX) injection  40 mg Subcutaneous Q24H  . hydrochlorothiazide  12.5 mg Oral Daily  . irbesartan  300 mg Oral Daily  . lidocaine  1 patch Transdermal Q24H  . metoprolol tartrate  25 mg Oral BID    Continuous Infusions: . sodium chloride 10 mL/hr at 08/21/20 0830    PRN Meds: hydrALAZINE, QUEtiapine  Physical Exam Vitals reviewed.  Constitutional:      General: She is not in acute distress.    Appearance: She is ill-appearing.  Cardiovascular:     Rate and Rhythm: Normal rate.  Pulmonary:     Effort: Pulmonary effort is normal.  Neurological:     Mental Status: She is alert. She is confused.     Motor: Weakness present.            Vital Signs: BP 135/70 (BP Location: Left Arm)   Pulse 89   Temp 97.8 F (36.6 C) (Oral)   Resp (!) 22   Wt 53 kg   SpO2 96%   BMI 20.70 kg/m  SpO2: SpO2: 96 % O2 Device: O2 Device: Room Air O2 Flow Rate: O2 Flow Rate (L/min): 2 L/min  Intake/output summary:  Intake/Output Summary (Last 24 hours) at 08/21/2020 1836 Last data filed at 08/21/2020 1819 Gross per 24 hour  Intake 1280.71 ml  Output 650 ml  Net 630.71 ml   LBM:   Baseline Weight: Weight: 53 kg Most recent weight: Weight: 53 kg       Palliative Assessment/Data: PPS 30%      Palliative Care Assessment & Plan   HPI/Patient Profile: 87 y.o. female  with past  medical history of osteoarthritis, hypertension, hyperlipidemia, GERD, and chronic T12 compression fracture. She presented to the emergency department on 08/17/20 with altered mental status. She had reportedly been having back pain and was prescribed a muscle relaxer. On the morning of 08/17/20, she called her neighbor and asked him to come over because she was in pain. When he went to her house he found she had fallen down by her bed so he called EMS. When EMS arrived, patient was not confused but then had a witnessed seizure and left sided weakness in route. On arrival to the ED, patient was altered and non-verbal. Code stroke was called and CT head was negative. CT spine negative. CTA head and neck was negative for large vessel occlusion. Bedside EEG showed moderate diffuse encephalopathy nonspecific etiology but no seizures or epileptiform discharges seen.   Assessment: - acute metabolic encephalopathy, weakness, seizure (probably due to PRES) - hypovolemic hyponatremia - leukocytosis - lactic acidosis (resolved) - acute back pain, history of T12 compression fracture  Recommendations/Plan: Continue DNR/DNI status as previously documented Continue current medical care Continue scheduled tylenol for back pain Disposition - SNF for rehab Recommend outpatient palliative at discharge Will complete MOST form prior to discharge PMT will continue to follow  Goals of Care and Additional Recommendations: Limitations on Scope of Treatment: Full Scope Treatment  Code Status: DNR/DNI   Prognosis:  Unable to determine  Discharge Planning: SNF for rehab    Thank you for allowing the Palliative Medicine Team to assist in the care of this patient.   Total Time 35 minutes Prolonged Time Billed  no       Greater than 50%  of this time was spent counseling and coordinating care related to the above assessment and plan.  Jodi Proud, NP  Please contact Palliative Medicine Team phone at  [phone]  for questions and concerns.

## 2020-08-22 LAB — CULTURE, BLOOD (ROUTINE X 2)
Culture: NO GROWTH
Culture: NO GROWTH

## 2020-08-22 MED ORDER — SODIUM CHLORIDE 0.9 % IV BOLUS
250.0000 mL | Freq: Once | INTRAVENOUS | Status: AC
  Administered 2020-08-22: 250 mL via INTRAVENOUS

## 2020-08-22 MED ORDER — AMLODIPINE BESYLATE 5 MG PO TABS: 5.0000 mg | ORAL_TABLET | Freq: Every day | ORAL | Status: DC

## 2020-08-22 NOTE — TOC Progression Note (Signed)
Transition of Care [facility]) - Progression Note    Patient Details  Name: Jodi Barr MRN: [id number] Date of Birth: 01-01-1933  Transition of Care [facility]) CM/SW Contact  Baldemar Lenis, [place] Phone Number: 08/22/2020, 11:26 AM  Clinical Narrative:   CSW spoke with [name] to provide bed offers. [name] to review options and determine SNF choice. CSW to follow.    Expected Discharge Plan: Skilled Nursing Facility Barriers to Discharge: Continued Medical Work up, English as a second language teacher  Expected Discharge Plan and Services Expected Discharge Plan: Skilled Nursing Facility     Post Acute Care Choice: Skilled Nursing Facility Living arrangements for the past 2 months: Single Family Home                                       Social Determinants of Health (SDOH) Interventions    Readmission Risk Interventions No flowsheet data found.

## 2020-08-22 NOTE — Progress Notes (Signed)
  Speech Language Pathology Treatment: Dysphagia  Patient Details Name: Jodi Barr MRN: [id number] DOB: Dec 25, 1932 Today's Date: 08/22/2020 Time: 6244-6950 SLP Time Calculation (min) (ACUTE ONLY): 17 min  Assessment / Plan / Recommendation Clinical Impression  Pt much more alert today.  She demonstrated improved orientation to self/time/surroundings and was interested in her care and medical condition.  She demonstrated improved ability to feed herself crackers with adequate mastication, no oral residue post-swallow, and consumed large quantities of water with no concerns for airway intrusion.  Recommend advancing diet to regular solids/thin liquids; continue meds whole in puree. Pt will need assistance setting up tray and ensuring safe self feeding.  SLP will follow-up briefly re: swallowing and focus tx on cognition.   HPI HPI: Pt is an 87 y.o. female after being found responsive and having a witnessed (by EMS) seizure with left sided weakness and AMS. CT head negative for acute changes.  MRI brain c/w PRESS. CXR 6/9: Small left effusion with underlying opacity, likely atelectasis. PMH: HTN, HLD, GERD, chronic T12 compression fracture, constipation.      SLP Plan  Continue with current plan of care       Recommendations  Diet recommendations: Regular;Thin liquid Liquids provided via: Straw Medication Administration: Whole meds with puree Supervision: Staff to assist with self feeding Compensations: Minimize environmental distractions Postural Changes and/or Swallow Maneuvers: Seated upright 90 degrees                Oral Care Recommendations: Oral care BID Follow up Recommendations: Skilled Nursing facility SLP Visit Diagnosis: Dysphagia, unspecified (R13.10) Plan: Continue with current plan of care       GO               Linsy Ehresman L. Samson Frederic, MA CCC/SLP Acute Rehabilitation Services Office number [phone] Pager [phone]  Blenda Mounts  Laurice 08/22/2020, 9:59 AM

## 2020-08-22 NOTE — Progress Notes (Signed)
Physical Therapy Treatment Patient Details Name: Jodi Barr MRN: [id number] DOB: 10/11/1932 Today's Date: 08/22/2020    History of Present Illness Pt is an 87 y.o. female after being found responsive and having a witnessed (by EMS) seizure with left sided weakness and AMS. CT and MRI negative for acute changes.  CXR 6/9: Small left effusion with underlying opacity, likely atelectasis. PMH: HTN, HLD, GERD, chronic T12 compression fracture, constipation.    PT Comments    Pt limited by pain and fatigue this session but is able to tolerate out of bed transfers. Pt continues to require significant physical assistance for all functional mobility at this time and remains at a high falls risk due to weakness. Pt will continue to benefit from aggressive mobilization and acute PT services to improve activity tolerance and reduce falls risk. PT continues to recommend SNF placement at this time.   Follow Up Recommendations  SNF     Equipment Recommendations  Wheelchair (measurements PT) (if D/C home)    Recommendations for Other Services       Precautions / Restrictions Precautions Precautions: Fall Restrictions Weight Bearing Restrictions: No    Mobility  Bed Mobility Overal bed mobility: Needs Assistance Bed Mobility: Supine to Sit     Supine to sit: Mod assist;HOB elevated     General bed mobility comments: pt requires assistance to pivot hips to edge of bed    Transfers Overall transfer level: Needs assistance Equipment used: 1 person hand held assist Transfers: Sit to/from Stand;Stand Pivot Transfers Sit to Stand: Max assist;Mod assist Stand pivot transfers: Mod assist       General transfer comment: maxA for initial attempt, modA for final trasfer attempt. PT provides BUE support and verbal cues for hip extension  Ambulation/Gait Ambulation/Gait assistance:  (pt declines attempts at ambulation)               Stairs             Wheelchair  Mobility    Modified Rankin (Stroke Patients Only)       Balance Overall balance assessment: Needs assistance Sitting-balance support: Single extremity supported;Bilateral upper extremity supported Sitting balance-Leahy Scale: Poor Sitting balance - Comments: reliant on UE support   Standing balance support: Bilateral upper extremity supported Standing balance-Leahy Scale: Poor Standing balance comment: minA with BUE support of PT                            Cognition Arousal/Alertness: Awake/alert Behavior During Therapy: Anxious Overall Cognitive Status: Impaired/Different from baseline Area of Impairment: Problem solving                             Problem Solving: Slow processing;Requires verbal cues;Requires tactile cues        Exercises      General Comments General comments (skin integrity, edema, etc.): pt tachy during session in 110s observed on monitor, HR 100s at rest      Pertinent Vitals/Pain Pain Assessment: Faces Faces Pain Scale: Hurts even more Pain Location: back Pain Descriptors / Indicators: Aching Pain Intervention(s): RN gave pain meds during session    Home Living                      Prior Function            PT Goals (current goals can now be found in the care  plan section) Acute Rehab PT Goals Patient Stated Goal: walk Progress towards PT goals: Progressing toward goals    Frequency    Min 2X/week      PT Plan Current plan remains appropriate    Co-evaluation              AM-PAC PT "6 Clicks" Mobility   Outcome Measure  Help needed turning from your back to your side while in a flat bed without using bedrails?: A Lot Help needed moving from lying on your back to sitting on the side of a flat bed without using bedrails?: A Lot Help needed moving to and from a bed to a chair (including a wheelchair)?: A Lot Help needed standing up from a chair using your arms (e.g., wheelchair or  bedside chair)?: A Lot Help needed to walk in hospital room?: A Lot Help needed climbing 3-5 steps with a railing? : Total 6 Click Score: 11    End of Session   Activity Tolerance: Patient limited by pain;Patient limited by fatigue Patient left: in chair;with call bell/phone within reach;with chair alarm set Nurse Communication: Mobility status PT Visit Diagnosis: Muscle weakness (generalized) (M62.81);Difficulty in walking, not elsewhere classified (R26.2);Pain Pain - part of body:  (back)     Time: 2505-3976 PT Time Calculation (min) (ACUTE ONLY): 23 min  Charges:  $Therapeutic Activity: 23-37 mins                     Arlyss Gandy, PT, DPT Acute Rehabilitation Pager: [phone]    Arlyss Gandy 08/22/2020, 4:08 PM

## 2020-08-22 NOTE — Progress Notes (Signed)
[facility] PROGRESS NOTE    Progress Note  Jodi LeaJeanene R Sloss  ZOX:[id number] DOB: Dec 25, 1932 DOA: 08/17/2020 PCP: Geoffry ParadiseAronson, Richard, MD     Brief Narrative:   Jodi Barr is an 87 y.o. female past medical history of essential hypertension hyperlipidemia chronic T12 compression, constipation comes into the hospital she was found to be unresponsive.  Patient was having back pain and started on muscle relaxant as an outpatient.  In the ED was found to have left-sided weakness and weakness effusion started on Keppra EEG was done that showed encephalopathy CT spine normal, CT angio of the head and neck no acute findings, CT of the head showed no acute intracranial abnormalities.    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 EEG showed showed no signs of seizures.   MRI of the brain showed PRESS.   Her blood pressure is elevated today she did not get hydralazine overnight, she just got given her antihypertensive medication, Benicar, metoprolol and hydrochlorothiazide, continue hydralazine IV as needed. Continue Keppra. Appreciate neurology's assistance.  Hypovolemic hyponatremia: Probably contributing to seizures resolved with IV fluids.  We will go ahead and KVO IV fluids.  Leukocytosis: With a T-max of 98.3 no clear source of infection question stress margination in the setting of seizures for blood cell trending down off antibiotics culture data has remained negative.  Essential hypertension: Significantly elevated today, she was restarted back on her home medications.  Hyperlipidemia: Continue statins.  Hypomagnesemia: Repleted try to keep greater than 2.  Lactic acidosis:  in the setting of seizures and dehydration now resolved.  History of T12 compression fracture: Avoid narcotics we will get physical therapy on board.    DVT prophylaxis: lovenox Family  Communication:none Status is: Inpatient  Remains inpatient appropriate because:Hemodynamically unstable  Dispo: The patient is from: Home              Anticipated d/c is to: SNF              Patient currently is not medically stable to d/c.   Difficult to place patient No      Code Status:     Code Status Orders  (From admission, onward)           Start     Ordered   08/17/20 1338  Full code  Continuous        08/17/20 1342           Code Status History     This patient has a current code status but no historical code status.         IV Access:   Peripheral IV   Procedures and diagnostic studies:   DG Chest 1 View  Result Date: 08/20/2020 CLINICAL DATA:  Follow-up abnormal chest x-ray EXAM: CHEST  1 VIEW COMPARISON:  08/17/2020 FINDINGS: Progression of left lower lobe atelectasis. Small left effusion unchanged. Progression of mild right lower lobe airspace disease and small right effusion. Negative for heart failure or edema. IMPRESSION: Progression of bibasilar atelectasis left greater than right. Progression of small right effusion. No change left effusion. Electronically Signed   By: Marlan Palauharles  Clark M.D.   On: 08/20/2020 10:45   DG Lumbar Spine 2-3 Views  Result Date: 08/20/2020 CLINICAL DATA:  Lumbar spine pain EXAM: LUMBAR SPINE - 2-3 VIEW COMPARISON:  CT scan of the abdomen and pelvis 11/25/2019 FINDINGS: Compression fractures present at T11, T12 and L1. The T12 compression fracture appears  chronic compared to prior images. The L1 compression fracture is new compared to September of 2021. The remaining lumbar vertebra remain unchanged. There is exaggerated focal kyphosis at the site of 3 contiguous fractures. L5-S1 facet arthropathy. IMPRESSION: 1. Age indeterminate L1 compression fracture is new compared to prior imaging from November 25, 2019. The fracture has occurred at some point in the interim. 2. Stable chronic T12 compression fracture. 3. Age  indeterminate T11 compression fracture, favor chronic. 4. Exaggerated focal kyphosis at the site of compression fractures. 5. L5-S1 facet arthropathy. Electronically Signed   By: Malachy Moan M.D.   On: 08/20/2020 10:48   MR BRAIN W WO CONTRAST  Result Date: 08/20/2020 CLINICAL DATA:  Seizure.  Significant hypertension on presentation. EXAM: MRI HEAD WITHOUT AND WITH CONTRAST TECHNIQUE: Multiplanar, multiecho pulse sequences of the brain and surrounding structures were obtained without and with intravenous contrast. CONTRAST:  5.18mL GADAVIST GADOBUTROL 1 MMOL/ML IV SOLN COMPARISON:  Head CT 08/18/2020 FINDINGS: Brain: There is no evidence of an acute infarct, intracranial hemorrhage, mass, midline shift, or extra-axial fluid collection. There are patchy T2 hyperintensities in the cerebral white matter bilaterally which are mild-to-moderate for age. Of note, there is relatively symmetric T2 hyperintensity in the subcortical white matter of the occipital, parietal, and posterior frontal lobes. There is mild-to-moderate cerebral atrophy. No abnormal enhancement is identified. Dedicated temporal lobe imaging is mildly motion degraded with symmetric size in grossly symmetric signal of the hippocampi. Vascular: Major intracranial vascular flow voids are preserved. Skull and upper cervical spine: Unremarkable bone marrow signal. Sinuses/Orbits: Unremarkable orbits. Scattered mild mucosal thickening in the paranasal sinuses. No significant mastoid fluid. Other: None. IMPRESSION: 1. 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 2. Background mild-to-moderate chronic small vessel ischemia in the cerebral white matter. 3. No acute infarct. Electronically Signed   By: Sebastian Ache M.D.   On: 08/20/2020 16:21     Medical Consultants:   None.   Subjective:    Jodi Lea eat breakfast  more awake this morning able to carry on a good conversation.  Objective:    Vitals:   08/21/20 1538 08/21/20 1925 08/21/20 2332 08/22/20 0806  BP: 135/70 (!) 173/77 (!) 169/95 (!) 192/89  Pulse: 89 100 81 62  Resp: (!) 22 18 19    Temp: 97.8 F (36.6 C) 99 F (37.2 C) 98 F (36.7 C)   TempSrc: Oral Oral Oral   SpO2: 96% 95% 96% 100%  Weight:       SpO2: 100 % O2 Flow Rate (L/min): 2 L/min   Intake/Output Summary (Last 24 hours) at 08/22/2020 0827 Last data filed at 08/22/2020 0550 Gross per 24 hour  Intake 230.94 ml  Output 1000 ml  Net -769.06 ml    Filed Weights   08/17/20 0900  Weight: 53 kg    Exam: General exam: In no acute distress. Respiratory system: Good air movement and clear to auscultation. Cardiovascular system: S1 & S2 heard, RRR. No JVD. Gastrointestinal system: Abdomen is nondistended, soft and nontender.  Extremities: No pedal edema. Skin: No rashes, lesions or ulcers  Data Reviewed:    Labs: Basic Metabolic Panel: Recent Labs  Lab 08/17/20 0900 08/17/20 0914 08/17/20 1450 08/18/20 0148 08/19/20 0828  NA 121* 121* 128* 129* 131*  K 3.9 3.9 3.0* 3.6 3.7  CL 88* 88* 94* 92* 102  CO2 19*  --  23 23 15*  GLUCOSE 178* 176* 140*  113* 99  BUN 14 14 11 11 23   CREATININE 1.08* 0.90 0.84 0.92 0.98  CALCIUM 9.1  --  8.2* 8.7* 8.5*  MG  --   --  1.4*  --   --     GFR CrCl cannot be calculated (Unknown ideal weight.). Liver Function Tests: Recent Labs  Lab 08/17/20 0900 08/18/20 0148  AST 44* 58*  ALT 32 35  ALKPHOS 77 69  BILITOT 0.9 1.0  PROT 6.4* 5.7*  ALBUMIN 3.6 3.1*    No results for input(s): LIPASE, AMYLASE in the last 168 hours. No results for input(s): AMMONIA in the last 168 hours. Coagulation profile Recent Labs  Lab 08/17/20 0900  INR 1.0    COVID-19 Labs  No results for input(s): DDIMER, FERRITIN, LDH, CRP in the last 72 hours.  Lab Results  Component Value Date   SARSCOV2NAA NEGATIVE 08/17/2020     CBC: Recent Labs  Lab 08/17/20 0900 08/17/20 0914 08/17/20 1450 08/18/20 0148 08/19/20 0828  WBC 20.0*  --  19.0* 16.2* 14.0*  NEUTROABS 17.7*  --  16.6* 13.6* 11.7*  HGB 13.3 13.9 13.0 14.3 12.3  HCT 39.1 41.0 37.4 40.8 36.9  MCV 93.8  --  91.4 90.9 95.3  PLT 371  --  348 323 284    Cardiac Enzymes: Recent Labs  Lab 08/17/20 1450  CKTOTAL 964*    BNP (last 3 results) No results for input(s): PROBNP in the last 8760 hours. CBG: Recent Labs  Lab 08/17/20 0858  GLUCAP 193*    D-Dimer: No results for input(s): DDIMER in the last 72 hours. Hgb A1c: No results for input(s): HGBA1C in the last 72 hours. Lipid Profile: No results for input(s): CHOL, HDL, LDLCALC, TRIG, CHOLHDL, LDLDIRECT in the last 72 hours.  Thyroid function studies: No results for input(s): TSH, T4TOTAL, T3FREE, THYROIDAB in the last 72 hours.  Invalid input(s): FREET3  Anemia work up: No results for input(s): VITAMINB12, FOLATE, FERRITIN, TIBC, IRON, RETICCTPCT in the last 72 hours.  Sepsis Labs: Recent Labs  Lab 08/17/20 0900 08/17/20 1347 08/17/20 1450 08/17/20 1757 08/17/20 2206 08/18/20 0148 08/19/20 0828  WBC 20.0*  --  19.0*  --   --  16.2* 14.0*  LATICACIDVEN  --  1.7  --  2.4* 2.2*  --  1.9    Microbiology Recent Results (from the past 240 hour(s))  Resp Panel by RT-PCR (Flu A&B, Covid) Nasopharyngeal Swab     Status: None   Collection Time: 08/17/20 10:22 AM   Specimen: Nasopharyngeal Swab; Nasopharyngeal(NP) swabs in vial transport medium  Result Value Ref Range Status   SARS Coronavirus 2 by RT PCR NEGATIVE NEGATIVE Final    Comment: (NOTE) SARS-CoV-2 target nucleic acids are NOT DETECTED.  The SARS-CoV-2 RNA is generally detectable in upper respiratory specimens during the acute phase of infection. The lowest concentration of SARS-CoV-2 viral copies this assay can detect is 138 copies/mL. A negative result does not preclude SARS-Cov-2 infection and should not  be used as the sole basis for treatment or other patient management decisions. A negative result may occur with  improper specimen collection/handling, submission of specimen other than nasopharyngeal swab, presence of viral mutation(s) within the areas targeted by this assay, and inadequate number of viral copies(<138 copies/mL). A negative result must be combined with clinical observations, patient history, and epidemiological information. The expected result is Negative.  Fact Sheet for Patients:  10/17/20  Fact Sheet for Healthcare Providers:  BloggerCourse.com  This test is no  t yet approved or cleared by the [place] and  has been authorized for detection and/or diagnosis of SARS-CoV-2 by FDA under an Emergency Use Authorization (EUA). This EUA will remain  in effect (meaning this test can be used) for the duration of the COVID-19 declaration under Section 564(b)(1) of the Act, 21 U.S.C.section 360bbb-3(b)(1), unless the authorization is terminated  or revoked sooner.       Influenza A by PCR NEGATIVE NEGATIVE Final   Influenza B by PCR NEGATIVE NEGATIVE Final    Comment: (NOTE) The Xpert Xpress SARS-CoV-2/FLU/RSV plus assay is intended as an aid in the diagnosis of influenza from Nasopharyngeal swab specimens and should not be used as a sole basis for treatment. Nasal washings and aspirates are unacceptable for Xpert Xpress SARS-CoV-2/FLU/RSV testing.  Fact Sheet for Patients: BloggerCourse.com  Fact Sheet for Healthcare Providers: SeriousBroker.it  This test is not yet approved or cleared by the [place] FDA and has been authorized for detection and/or diagnosis of SARS-CoV-2 by FDA under an Emergency Use Authorization (EUA). This EUA will remain in effect (meaning this test can be used) for the duration of the COVID-19 declaration under Section  564(b)(1) of the Act, 21 U.S.C. section 360bbb-3(b)(1), unless the authorization is terminated or revoked.  Performed at [facility], [address], [place], [place] [address]   Culture, blood (routine x 2)     Status: None   Collection Time: 08/17/20  1:20 PM   Specimen: BLOOD  Result Value Ref Range Status   Specimen Description BLOOD RIGHT ANTECUBITAL  Final   Special Requests   Final    BOTTLES DRAWN AEROBIC AND ANAEROBIC Blood Culture results may not be optimal due to an inadequate volume of blood received in culture bottles   Culture   Final    NO GROWTH 5 DAYS Performed at [facility], [address], [place], [place] [address]    Report Status 08/22/2020 FINAL  Final  Culture, blood (routine x 2)     Status: None   Collection Time: 08/17/20  2:50 PM   Specimen: BLOOD  Result Value Ref Range Status   Specimen Description BLOOD RIGHT ANTECUBITAL  Final   Special Requests   Final    BOTTLES DRAWN AEROBIC AND ANAEROBIC Blood Culture results may not be optimal due to an inadequate volume of blood received in culture bottles   Culture   Final    NO GROWTH 5 DAYS Performed at [facility], [address] [place], [place] [address]    Report Status 08/22/2020 FINAL  Final     Medications:    acetaminophen  1,000 mg Oral Q8H   atorvastatin  40 mg Per Tube Daily   Chlorhexidine Gluconate Cloth  6 each Topical Daily   enoxaparin (LOVENOX) injection  40 mg Subcutaneous Q24H   hydrochlorothiazide  12.5 mg Oral Daily   irbesartan  300 mg Oral Daily   lidocaine  1 patch Transdermal Q24H   metoprolol tartrate  25 mg Oral BID   Continuous Infusions:  sodium chloride 10 mL/hr at 08/21/20 1858      LOS: 5 days   Marinda Elk  [facility]  08/22/2020, 8:27 AM

## 2020-08-23 LAB — SARS CORONAVIRUS 2 (TAT 6-24 HRS): SARS Coronavirus 2: NEGATIVE

## 2020-08-23 MED ORDER — MUSCLE RUB 10-15 % EX CREA
TOPICAL_CREAM | CUTANEOUS | Status: DC | PRN
  Administered 2020-08-23 – 2020-08-25 (×2): 1 via TOPICAL
  Filled 2020-08-23: qty 85

## 2020-08-23 MED ORDER — AMLODIPINE BESYLATE 2.5 MG PO TABS
2.5000 mg | ORAL_TABLET | Freq: Every day | ORAL | Status: DC
  Administered 2020-08-23: 2.5 mg via ORAL
  Filled 2020-08-23: qty 1

## 2020-08-23 NOTE — Plan of Care (Signed)

## 2020-08-23 NOTE — Progress Notes (Signed)
This chaplain is present at the Pt. bedside for F/U spiritual care.  The Pt awakens easily to the call of her name and is able to respond appropriately during the conversation with the chaplain.   The Pt. shares yesterday's time in the bedside recliner was very painful, but she is willing to continue to participate with the goal of regaining her strength.  The Pt. questions her diet restrictions as the Pt. reflects on her hunger and yogurt is not enough for breakfast.   The chaplain practiced reflective listening and affirmed the Pt. faith and peace she finds in prayer.  The peace the Pt. describes is present in her choice for rehab and in God's choice of death. The Pt. finds the comfort she is describing in prayer, scripture, and hymns.   The chaplain understands the Pt. is surrounded by neighbors, including Lyle-HCPOA, who she calls friends. The Pt. is able to name her neighbors and where in proximity to her house they live.  The Pt. accepted the chaplain's invitation to listen to a favorite hymn and prayer.  The Pt. requested F/U spiritual care on Thursday.

## 2020-08-23 NOTE — Plan of Care (Signed)
  Problem: Clinical Measurements: Goal: Respiratory complications will improve Outcome: Progressing   Problem: Clinical Measurements: Goal: Cardiovascular complication will be avoided Outcome: Progressing   Problem: Nutrition: Goal: Adequate nutrition will be maintained Outcome: Progressing   Problem: Safety: Goal: Ability to remain free from injury will improve Outcome: Progressing   Problem: Skin Integrity: Goal: Risk for impaired skin integrity will decrease Outcome: Progressing   

## 2020-08-23 NOTE — TOC Progression Note (Addendum)
Transition of Care [facility]) - Progression Note    Patient Details  Name: Jodi Barr MRN: [id number] Date of Birth: 04-22-1932  Transition of Care [facility]) CM/SW Contact  Baldemar [place], [place] Phone Number: 08/23/2020, 12:52 PM  Clinical Narrative:   CSW spoke with patient's Jodi Barr, about bed offers. Jodi Barr and his wife are still reviewing. Lyle asked about [facility], as it is still pending, and CSW asked them to review. CSW updated Jodi Barr that the MD is hopeful for discharge tomorrow, if they are able to pick a SNF today. CSW to follow back up with them for SNF choice.  UPDATE: CSW received update from [place] asking about [facility], [facility], [facility], and [facility]. Patient is not vaccinated, so [facility] is unable to offer a bed as they are only taking patients who are vaccinated. [facility], [facility], and [facility] are full and unable to offer a bed. [facility] is reviewing, but they only have one bed available at this time and someone else is ahead of the patient for taking that bed, so they are unsure if they will be able to offer or not. CSW updated Jodi Barr and he is continuing to review the other options available. CSW to follow.    Expected Discharge Plan: Skilled Nursing Facility Barriers to Discharge: Continued Medical Work up, English as a second language teacher  Expected Discharge Plan and Services Expected Discharge Plan: Skilled Nursing Facility     Post Acute Care Choice: Skilled Nursing Facility Living arrangements for the past 2 months: Single Family Home                                       Social Determinants of Health (SDOH) Interventions    Readmission Risk Interventions No flowsheet data found.

## 2020-08-23 NOTE — Progress Notes (Signed)
Patient had Muscle rub cream PRN orders and had allergy contraindications spoke with pharmacy and was told it's ok to order. Ilean Skill LPN

## 2020-08-23 NOTE — Progress Notes (Signed)
[facility] PROGRESS NOTE    Progress Note  JORGINA Barr  ZOX:[id number] DOB: 09-Dec-1932 DOA: 08/17/2020 PCP: Geoffry Paradise, MD     Brief Narrative:   Jodi Barr is an 87 y.o. female past medical history of essential hypertension hyperlipidemia chronic T12 compression, constipation comes into the hospital she was found to be unresponsive.  Patient was having back pain and started on muscle relaxant as an outpatient.  In the ED was found to have left-sided weakness and weakness effusion started on Keppra EEG was done that showed encephalopathy CT spine normal, CT angio of the head and neck no acute findings, CT of the head showed no acute intracranial abnormalities.    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 EEG showed showed no signs of seizures.   MRI of the brain showed PRESS.   Blood pressure ranging 150s to 170s. Started on low-dose Norvasc, continue hydrochlorothiazide and Avapro as well as metoprolol her blood pressure slowly coming down mentation is improving. Keppra has been discontinued. Physical therapy evaluated the patient recommended skilled nursing facility.  Hypovolemic hyponatremia: Probably contributing to seizures resolved with IV fluids.  We will go ahead and KVO IV fluids.  Leukocytosis: With a T-max of 98.3 no clear source of infection question stress margination in the setting of seizures for blood cell trending down off antibiotics culture data has remained negative.  Essential hypertension: Significantly elevated today, she was restarted back on her home medications.  Hyperlipidemia: Continue statins.  Hypomagnesemia: Repleted try to keep greater than 2.  Lactic acidosis:  in the setting of seizures and dehydration now resolved.  History of T12 compression fracture: Avoid narcotics we will get physical therapy on board.    DVT prophylaxis:  lovenox Family Communication:none Status is: Inpatient  Remains inpatient appropriate because:Hemodynamically unstable  Dispo: The patient is from: Home              Anticipated d/c is to: SNF              Patient currently is not medically stable to d/c.   Difficult to place patient No      Code Status:     Code Status Orders  (From admission, onward)           Start     Ordered   08/17/20 1338  Full code  Continuous        08/17/20 1342           Code Status History     This patient has a current code status but no historical code status.         IV Access:   Peripheral IV   Procedures and diagnostic studies:   No results found.   Medical Consultants:   None.   Subjective:    [name] relates she is hungry complaining of back pain..  Objective:    Vitals:   08/22/20 2052 08/22/20 2329 08/23/20 0410 08/23/20 0734  BP: (!) 177/75 (!) 144/67 (!) 157/71 (!) 175/77  Pulse: 81 91 98 92  Resp: 18 19 19 16   Temp: 97.9 F (36.6 C) 98.1 F (36.7 C) 98.2 F (36.8 C) 98.2 F (36.8 C)  TempSrc: Oral Oral Oral Oral  SpO2: 100% 100% 100% 100%  Weight:       SpO2: 100 % O2 Flow Rate (L/min): 3 L/min   Intake/Output Summary (Last 24 hours) at 08/23/2020 0744 Last data  filed at 08/23/2020 0534 Gross per 24 hour  Intake 120 ml  Output 750 ml  Net -630 ml    Filed Weights   08/17/20 0900  Weight: 53 kg    Exam: General exam: In no acute distress. Respiratory system: Good air movement and clear to auscultation. Cardiovascular system: S1 & S2 heard, RRR. No JVD. Gastrointestinal system: Abdomen is nondistended, soft and nontender.  Extremities: No pedal edema. Skin: No rashes, lesions or ulcers Psychiatry: Judgement and insight appear normal. Mood & affect appropriate.  Data Reviewed:    Labs: Basic Metabolic Panel: Recent Labs  Lab 08/17/20 0900 08/17/20 0914 08/17/20 1450 08/18/20 0148 08/19/20 0828  NA 121*  121* 128* 129* 131*  K 3.9 3.9 3.0* 3.6 3.7  CL 88* 88* 94* 92* 102  CO2 19*  --  23 23 15*  GLUCOSE 178* 176* 140* 113* 99  BUN 14 14 11 11 23   CREATININE 1.08* 0.90 0.84 0.92 0.98  CALCIUM 9.1  --  8.2* 8.7* 8.5*  MG  --   --  1.4*  --   --     GFR CrCl cannot be calculated (Unknown ideal weight.). Liver Function Tests: Recent Labs  Lab 08/17/20 0900 08/18/20 0148  AST 44* 58*  ALT 32 35  ALKPHOS 77 69  BILITOT 0.9 1.0  PROT 6.4* 5.7*  ALBUMIN 3.6 3.1*    No results for input(s): LIPASE, AMYLASE in the last 168 hours. No results for input(s): AMMONIA in the last 168 hours. Coagulation profile Recent Labs  Lab 08/17/20 0900  INR 1.0    COVID-19 Labs  No results for input(s): DDIMER, FERRITIN, LDH, CRP in the last 72 hours.  Lab Results  Component Value Date   SARSCOV2NAA NEGATIVE 08/17/2020    CBC: Recent Labs  Lab 08/17/20 0900 08/17/20 0914 08/17/20 1450 08/18/20 0148 08/19/20 0828  WBC 20.0*  --  19.0* 16.2* 14.0*  NEUTROABS 17.7*  --  16.6* 13.6* 11.7*  HGB 13.3 13.9 13.0 14.3 12.3  HCT 39.1 41.0 37.4 40.8 36.9  MCV 93.8  --  91.4 90.9 95.3  PLT 371  --  348 323 284    Cardiac Enzymes: Recent Labs  Lab 08/17/20 1450  CKTOTAL 964*    BNP (last 3 results) No results for input(s): PROBNP in the last 8760 hours. CBG: Recent Labs  Lab 08/17/20 0858  GLUCAP 193*    D-Dimer: No results for input(s): DDIMER in the last 72 hours. Hgb A1c: No results for input(s): HGBA1C in the last 72 hours. Lipid Profile: No results for input(s): CHOL, HDL, LDLCALC, TRIG, CHOLHDL, LDLDIRECT in the last 72 hours.  Thyroid function studies: No results for input(s): TSH, T4TOTAL, T3FREE, THYROIDAB in the last 72 hours.  Invalid input(s): FREET3  Anemia work up: No results for input(s): VITAMINB12, FOLATE, FERRITIN, TIBC, IRON, RETICCTPCT in the last 72 hours.  Sepsis Labs: Recent Labs  Lab 08/17/20 0900 08/17/20 1347 08/17/20 1450  08/17/20 1757 08/17/20 2206 08/18/20 0148 08/19/20 0828  WBC 20.0*  --  19.0*  --   --  16.2* 14.0*  LATICACIDVEN  --  1.7  --  2.4* 2.2*  --  1.9    Microbiology Recent Results (from the past 240 hour(s))  Resp Panel by RT-PCR (Flu A&B, Covid) Nasopharyngeal Swab     Status: None   Collection Time: 08/17/20 10:22 AM   Specimen: Nasopharyngeal Swab; Nasopharyngeal(NP) swabs in vial transport medium  Result Value Ref Range Status   SARS  Coronavirus 2 by RT PCR NEGATIVE NEGATIVE Final    Comment: (NOTE) SARS-CoV-2 target nucleic acids are NOT DETECTED.  The SARS-CoV-2 RNA is generally detectable in upper respiratory specimens during the acute phase of infection. The lowest concentration of SARS-CoV-2 viral copies this assay can detect is 138 copies/mL. A negative result does not preclude SARS-Cov-2 infection and should not be used as the sole basis for treatment or other patient management decisions. A negative result may occur with  improper specimen collection/handling, submission of specimen other than nasopharyngeal swab, presence of viral mutation(s) within the areas targeted by this assay, and inadequate number of viral copies(<138 copies/mL). A negative result must be combined with clinical observations, patient history, and epidemiological information. The expected result is Negative.  Fact Sheet for Patients:  BloggerCourse.comhttps://www.fda.gov/media/152166/download  Fact Sheet for Healthcare Providers:  SeriousBroker.ithttps://www.fda.gov/media/152162/download  This test is no t yet approved or cleared by the [place] FDA and  has been authorized for detection and/or diagnosis of SARS-CoV-2 by FDA under an Emergency Use Authorization (EUA). This EUA will remain  in effect (meaning this test can be used) for the duration of the COVID-19 declaration under Section 564(b)(1) of the Act, 21 U.S.C.section 360bbb-3(b)(1), unless the authorization is terminated  or revoked sooner.        Influenza A by PCR NEGATIVE NEGATIVE Final   Influenza B by PCR NEGATIVE NEGATIVE Final    Comment: (NOTE) The Xpert Xpress SARS-CoV-2/FLU/RSV plus assay is intended as an aid in the diagnosis of influenza from Nasopharyngeal swab specimens and should not be used as a sole basis for treatment. Nasal washings and aspirates are unacceptable for Xpert Xpress SARS-CoV-2/FLU/RSV testing.  Fact Sheet for Patients: BloggerCourse.comhttps://www.fda.gov/media/152166/download  Fact Sheet for Healthcare Providers: SeriousBroker.ithttps://www.fda.gov/media/152162/download  This test is not yet approved or cleared by the [place] FDA and has been authorized for detection and/or diagnosis of SARS-CoV-2 by FDA under an Emergency Use Authorization (EUA). This EUA will remain in effect (meaning this test can be used) for the duration of the COVID-19 declaration under Section 564(b)(1) of the Act, 21 U.S.C. section 360bbb-3(b)(1), unless the authorization is terminated or revoked.  Performed at [facility], [address] [place], [place] [address]   Culture, blood (routine x 2)     Status: None   Collection Time: 08/17/20  1:20 PM   Specimen: BLOOD  Result Value Ref Range Status   Specimen Description BLOOD RIGHT ANTECUBITAL  Final   Special Requests   Final    BOTTLES DRAWN AEROBIC AND ANAEROBIC Blood Culture results may not be optimal due to an inadequate volume of blood received in culture bottles   Culture   Final    NO GROWTH 5 DAYS Performed at [facility], [address] [place], [place] [address]    Report Status 08/22/2020 FINAL  Final  Culture, blood (routine x 2)     Status: None   Collection Time: 08/17/20  2:50 PM   Specimen: BLOOD  Result Value Ref Range Status   Specimen Description BLOOD RIGHT ANTECUBITAL  Final   Special Requests   Final    BOTTLES DRAWN AEROBIC AND ANAEROBIC Blood Culture results may not be optimal due to an inadequate volume of blood received in culture bottles    Culture   Final    NO GROWTH 5 DAYS Performed at [facility], [address] [place], [place] [address]    Report Status 08/22/2020 FINAL  Final  Medications:    acetaminophen  1,000 mg Oral Q8H   atorvastatin  40 mg Per Tube Daily   Chlorhexidine Gluconate Cloth  6 each Topical Daily   enoxaparin (LOVENOX) injection  40 mg Subcutaneous Q24H   hydrochlorothiazide  12.5 mg Oral Daily   irbesartan  300 mg Oral Daily   lidocaine  1 patch Transdermal Q24H   metoprolol tartrate  25 mg Oral BID   Continuous Infusions:  sodium chloride 10 mL/hr at 08/21/20 1858      LOS: 6 days   Marinda Elk  [facility]  08/23/2020, 7:44 AM

## 2020-08-24 ENCOUNTER — Other Ambulatory Visit (HOSPITAL_COMMUNITY): Payer: Self-pay

## 2020-08-24 DIAGNOSIS — R296 Repeated falls: Secondary | ICD-10-CM

## 2020-08-24 DIAGNOSIS — Z515 Encounter for palliative care: Secondary | ICD-10-CM

## 2020-08-24 DIAGNOSIS — I4891 Unspecified atrial fibrillation: Secondary | ICD-10-CM

## 2020-08-24 DIAGNOSIS — Z7189 Other specified counseling: Secondary | ICD-10-CM

## 2020-08-24 LAB — CREATININE, SERUM
Creatinine, Ser: 0.79 mg/dL (ref 0.44–1.00)
GFR, Estimated: >60 mL/min (ref >60)

## 2020-08-24 MED ORDER — DULOXETINE HCL 30 MG PO CPEP
30.0000 mg | ORAL_CAPSULE | Freq: Every day | ORAL | Status: DC
  Administered 2020-08-24 – 2020-08-29 (×6): 30 mg via ORAL
  Filled 2020-08-24 (×6): qty 1

## 2020-08-24 MED ORDER — TRAMADOL HCL 50 MG PO TABS
50.0000 mg | ORAL_TABLET | Freq: Three times a day (TID) | ORAL | Status: DC | PRN
  Administered 2020-08-24: 50 mg via ORAL
  Filled 2020-08-24: qty 1

## 2020-08-24 MED ORDER — POLYETHYLENE GLYCOL 3350 17 G PO PACK
17.0000 g | PACK | Freq: Two times a day (BID) | ORAL | Status: AC
  Administered 2020-08-24 – 2020-08-25 (×3): 17 g via ORAL
  Filled 2020-08-24 (×4): qty 1

## 2020-08-24 MED ORDER — ATORVASTATIN CALCIUM 40 MG PO TABS
40.0000 mg | ORAL_TABLET | Freq: Every day | ORAL | Status: DC
  Administered 2020-08-24 – 2020-08-29 (×6): 40 mg via ORAL
  Filled 2020-08-24 (×6): qty 1

## 2020-08-24 MED ORDER — APIXABAN 2.5 MG PO TABS
2.5000 mg | ORAL_TABLET | Freq: Two times a day (BID) | ORAL | Status: DC
  Administered 2020-08-24 (×2): 2.5 mg via ORAL
  Filled 2020-08-24 (×2): qty 1

## 2020-08-24 MED ORDER — AMLODIPINE BESYLATE 5 MG PO TABS
5.0000 mg | ORAL_TABLET | Freq: Every day | ORAL | Status: DC
  Administered 2020-08-24 – 2020-08-26 (×3): 5 mg via ORAL
  Filled 2020-08-24 (×3): qty 1

## 2020-08-24 MED ORDER — CARVEDILOL 12.5 MG PO TABS
12.5000 mg | ORAL_TABLET | Freq: Two times a day (BID) | ORAL | Status: DC
  Administered 2020-08-24 – 2020-08-26 (×4): 12.5 mg via ORAL
  Filled 2020-08-24 (×4): qty 1

## 2020-08-24 NOTE — TOC Progression Note (Signed)
Transition of Care [facility]) - Progression Note    Patient Details  Name: Jodi Barr MRN: [id number] Date of Birth: Mar 26, 1932  Transition of [facility]) CM/SW Contact  Baldemar Lenis, [place] Phone Number: 08/24/2020, 3:07 PM  Clinical Narrative:   CSW continuing to follow for SNF placement. CSW spoke with [facility] and they are unable to offer a bed for the patient, as they have no beds available. CSW spoke with [name] to update, and he said he is still reviewing the other options. He said he's just trying to find the best place for the patient, asked to call CSW back later. CSW to continue to follow.    Expected Discharge Plan: Skilled Nursing Facility Barriers to Discharge: Continued Medical Work up, English as a second language teacher  Expected Discharge Plan and Services Expected Discharge Plan: Skilled Nursing Facility     Post Acute Care Choice: Skilled Nursing Facility Living arrangements for the past 2 months: Single Family Home                                       Social Determinants of Health (SDOH) Interventions    Readmission Risk Interventions No flowsheet data found.

## 2020-08-24 NOTE — Progress Notes (Signed)
  Speech Language Pathology Treatment: Dysphagia;Cognitive-Linquistic  Patient Details Name: Jodi Barr MRN: [id number] DOB: 03/26/1932 Today's Date: 08/24/2020 Time: 4462-8638 SLP Time Calculation (min) (ACUTE ONLY): 23 min  Assessment / Plan / Recommendation Clinical Impression  Pt sitting in recliner, just finished breakfast.  Overall, her swallowing has improved significantly as her mental status has improved.  She demonstrates more functional mastication and reliable airway protection with very few cues needed. Primarily she needs encouragement to eat.  Recommend continuing regular diet,thin liquids. No f/u for swallowing is needed.  Cognition marked by improving orientation to elements of time/place; she remains unclear about medical condition.  With initial verbal cue, she searches environment for information to assist with orientation.  Short term memory is marginally better. Pt expresses angst about her future; she has doubts about progressing in rehab and refers frequently to the "Lord taking me home."  I reached out to Chaplain's service today to request a visit from them.  Pt has no further SLP needs in this venue of care - she may benefit from reassessment once she transitions to SNF to determine functional needs in that environment.    HPI HPI: Pt is an 87 y.o. female after being found responsive and having a witnessed (by EMS) seizure with left sided weakness and AMS. CT head negative for acute changes.  MRI brain c/w PRESS. CXR 6/9: Small left effusion with underlying opacity, likely atelectasis. PMH: HTN, HLD, GERD, chronic T12 compression fracture, constipation.      SLP Plan  All goals met       Recommendations  Diet recommendations: Regular;Thin liquid Liquids provided via: Straw Medication Administration: Whole meds with puree Supervision: Staff to assist with self feeding Compensations: Minimize environmental distractions Postural Changes and/or Swallow  Maneuvers: Seated upright 90 degrees                Oral Care Recommendations: Oral care BID Follow up Recommendations: Skilled Nursing facility SLP Visit Diagnosis: Dysphagia, unspecified (R13.10) Plan: All goals met       GO               Jodi Barr Jodi Barr, Jodi Barr number [phone] Pager [phone]  Jodi Barr 08/24/2020, 9:20 AM

## 2020-08-24 NOTE — Progress Notes (Addendum)
Daily Progress Note   Patient Name: Jodi Barr       Date: 08/24/2020 DOB: 1932-05-06  Age: 87 y.o. MRN#: [id number] Attending Physician: Charlynne Cousins, MD Primary Care Physician: Burnard Bunting, MD Admit Date: 08/17/2020  Reason for Consultation/Follow-up: Establishing goals of care, Pain control, and Psychosocial/spiritual support  Subjective: 15:35--Went to see patient at bedside. Her mental status has significantly improved since I last saw her on 6/14. She reports 10/10 pain in her back (she states "all up and down" her spine). She states it is constant, "stabbing" in quality, and makes it difficult for her to mobilize. She reports being OOB to the recliner for 3 hours.  Jodi Barr shares that she is "ready to go to heaven", due to being in so much pain. She also verbalizes missing her husband. She states "I ask God every day to call me home".   I completed a MOST form today with Jodi Barr. The patient outlined her wishes for the following treatment decisions:  Cardiopulmonary Resuscitation: Do Not Attempt Resuscitation (DNR/No CPR)  Medical Interventions: Comfort Measures: Keep clean, warm, and dry. Use medication by any route, positioning, wound care, and other measures to relieve pain and suffering. Use oxygen, suction and manual treatment of airway obstruction as needed for comfort. Do not transfer to the hospital unless comfort needs cannot be met in current location.  Antibiotics: Antibiotics if indicated  IV Fluids: IV fluids for a defined trial period  Feeding Tube: No feeding tube    16:45--Spoke with Jodi Barr by phone. She agrees with more aggressive pain management. She is hopeful that if Jodi Barr feels better, she will do better with therapy. Discussed that  even though Jodi Barr states she is ready to die, she is not likely hospice eligible at this point.   17:15 - Allergy to Tramadol noted. Returned to bedside to discuss with patient. She does not recall ever taking tramadol. She denies having a serious reaction (anaphylaxis, swelling/tingling of the tongue/mouth, or hives) to any medication.   Length of Stay: 7  Current Medications: Scheduled Meds:  . acetaminophen  1,000 mg Oral Q8H  . amLODipine  5 mg Oral Daily  . apixaban  2.5 mg Oral BID  . atorvastatin  40 mg Oral Daily  . carvedilol  12.5 mg Oral BID WC  .  Chlorhexidine Gluconate Cloth  6 each Topical Daily  . hydrochlorothiazide  12.5 mg Oral Daily  . irbesartan  300 mg Oral Daily  . lidocaine  1 patch Transdermal Q24H  . polyethylene glycol  17 g Oral BID    Continuous Infusions: . sodium chloride 10 mL/hr at 08/24/20 0028    PRN Meds: hydrALAZINE, Muscle Rub, QUEtiapine  Physical Exam Vitals reviewed.  Constitutional:      General: She is not in acute distress.    Appearance: She is Barr-appearing.  Cardiovascular:     Rate and Rhythm: Tachycardia present.     Comments: A-fib Pulmonary:     Effort: Pulmonary effort is normal.  Neurological:     Mental Status: She is alert and oriented to person, place, and time.     Motor: Weakness present.            Vital Signs: BP 137/62   Pulse 62   Temp 98.5 F (36.9 C) (Oral)   Resp 20   Wt 53 kg   SpO2 99%   BMI 20.70 kg/m  SpO2: SpO2: 99 % O2 Device: O2 Device: Room Air, Nasal Cannula O2 Flow Rate: O2 Flow Rate (L/min): 3 L/min  Intake/output summary:  Intake/Output Summary (Last 24 hours) at 08/24/2020 1534 Last data filed at 08/24/2020 1051 Gross per 24 hour  Intake 360 ml  Output 900 ml  Net -540 ml   LBM: Last BM Date:  (unsure) Baseline Weight: Weight: 53 kg Most recent weight: Weight: 53 kg       Palliative Assessment/Data: PPS 40%      Palliative Care Assessment & Plan   HPI/Patient  Profile: 87 y.o. female  with past medical history of osteoarthritis, hypertension, hyperlipidemia, GERD, and chronic T12 compression fracture. She presented to the emergency department on 08/17/20 with altered mental status. She had reportedly been having back pain and was prescribed a muscle relaxer. On the morning of 08/17/20, she called her neighbor and asked him to come over because she was in pain. When he went to her house he found she had fallen down by her bed so he called EMS. When EMS arrived, patient was not confused but then had a witnessed seizure and left sided weakness in route. On arrival to the ED, patient was altered and non-verbal. Code stroke was called and CT head was negative. CT spine negative. CTA head and neck was negative for large vessel occlusion. Bedside EEG showed moderate diffuse encephalopathy nonspecific etiology but no seizures or epileptiform discharges seen.    Assessment: - acute metabolic encephalopathy, weakness, seizure (probably due to PRES) - hypovolemic hyponatremia - leukocytosis - lactic acidosis (resolved) - acute on chronic back pain, history of T12 compression fracture  Recommendations/Plan: Continue current medical care MOST form completed - original to be placed on shadow chart and copy made to scan into [facility] Continue scheduled tylenol 1000 mg every 8 hours Start duloxetine (Cymbalta) 30 mg daily Start Tramadol (Ultram) 50 mg every 8 hours as needed for pain PMT will continue to follow  Goals of Care and Additional Recommendations: Limitations on Scope of Treatment: No Artificial Feeding  Code Status: DNR/DNI   Prognosis:  Unable to determine  Discharge Planning: Reserve for rehab with Palliative care service follow-up  Care plan was discussed with Dr. Aileen Fass and bedside RN  Thank you for allowing the Palliative Medicine Team to assist in the care of this patient.   Total Time 35 minutes Prolonged Time Billed  no       Greater than 50%  of this time was spent counseling and coordinating care related to the above assessment and plan.  Lavena Bullion, NP  Please contact Palliative Medicine Team phone at [phone] for questions and concerns.

## 2020-08-24 NOTE — Plan of Care (Signed)
  Problem: Ischemic Stroke/TIA Tissue Perfusion: Goal: Complications of ischemic stroke/TIA will be minimized 08/24/2020 0041 by Milon Score, RN Outcome: Progressing 08/23/2020 2048 by Milon Score, RN Outcome: Progressing

## 2020-08-24 NOTE — Plan of Care (Signed)
  Problem: Education: Goal: Knowledge of General Education information will improve Description: Including pain rating scale, medication(s)/side effects and non-pharmacologic comfort measures Outcome: Progressing   Problem: Health Behavior/Discharge Planning: Goal: Ability to manage health-related needs will improve Outcome: Progressing   Problem: Clinical Measurements: Goal: Ability to maintain clinical measurements within normal limits will improve Outcome: Progressing Goal: Will remain free from infection Outcome: Progressing Goal: Diagnostic test results will improve Outcome: Progressing Goal: Respiratory complications will improve Outcome: Progressing Goal: Cardiovascular complication will be avoided Outcome: Progressing   Problem: Activity: Goal: Risk for activity intolerance will decrease Outcome: Progressing   Problem: Nutrition: Goal: Adequate nutrition will be maintained Outcome: Progressing   Problem: Coping: Goal: Level of anxiety will decrease Outcome: Progressing   Problem: Elimination: Goal: Will not experience complications related to bowel motility Outcome: Progressing Goal: Will not experience complications related to urinary retention Outcome: Progressing   Problem: Pain Managment: Goal: General experience of comfort will improve Outcome: Progressing   Problem: Safety: Goal: Ability to remain free from injury will improve Outcome: Progressing   Problem: Skin Integrity: Goal: Risk for impaired skin integrity will decrease Outcome: Progressing   Problem: Education: Goal: Expressions of having a comfortable level of knowledge regarding the disease process will increase Outcome: Progressing   Problem: Coping: Goal: Ability to adjust to condition or change in health will improve Outcome: Progressing Goal: Ability to identify appropriate support needs will improve Outcome: Progressing   Problem: Health Behavior/Discharge Planning: Goal:  Compliance with prescribed medication regimen will improve Outcome: Progressing   Problem: Medication: Goal: Risk for medication side effects will decrease Outcome: Progressing   Problem: Clinical Measurements: Goal: Complications related to the disease process, condition or treatment will be avoided or minimized Outcome: Progressing Goal: Diagnostic test results will improve Outcome: Progressing   Problem: Safety: Goal: Verbalization of understanding the information provided will improve Outcome: Progressing   Problem: Self-Concept: Goal: Level of anxiety will decrease Outcome: Progressing Goal: Ability to verbalize feelings about condition will improve Outcome: Progressing   Problem: Education: Goal: Knowledge of disease or condition will improve Outcome: Progressing Goal: Knowledge of secondary prevention will improve Outcome: Progressing Goal: Knowledge of patient specific risk factors addressed and post discharge goals established will improve Outcome: Progressing Goal: Individualized Educational Video(s) Outcome: Progressing   Problem: Coping: Goal: Will verbalize positive feelings about self Outcome: Progressing Goal: Will identify appropriate support needs Outcome: Progressing   Problem: Health Behavior/Discharge Planning: Goal: Ability to manage health-related needs will improve Outcome: Progressing   Problem: Self-Care: Goal: Ability to participate in self-care as condition permits will improve Outcome: Progressing Goal: Verbalization of feelings and concerns over difficulty with self-care will improve Outcome: Progressing Goal: Ability to communicate needs accurately will improve Outcome: Progressing   Problem: Nutrition: Goal: Risk of aspiration will decrease Outcome: Progressing Goal: Dietary intake will improve Outcome: Progressing   Problem: Ischemic Stroke/TIA Tissue Perfusion: Goal: Complications of ischemic stroke/TIA will be  minimized Outcome: Progressing   

## 2020-08-24 NOTE — Progress Notes (Signed)
This chaplain is present for F/U spiritual care.  The Pt. neighbor-Jodi Barr is at the bedside and steps away after the chaplain introduction.  The chaplain is a witness to the Pt. increased clarity in conversation on day 3 of spiritual care visits. Today, the chaplain understands the Pt. "is ready to die if God is willing to call her home."  The Pt. describes her back pain as a "10" on a level of 1-10.  Jodi Barr is present when the Pt. agrees to having the PMT NP-Julia McIlquham revisit goals of care.   The Pt. and chaplain spend time together in scripture and recognizing the Pt. constant relationship with God through prayer.

## 2020-08-24 NOTE — TOC Benefit Eligibility Note (Addendum)
Patient Product/process development scientist completed.    The patient is currently admitted and upon discharge could be taking Eliquis 2.5 mg .  The current 30 day co-pay is, $40.00.   The patient is insured through [employer] Medicare Part D     Jodi Barr, CPhT Pharmacy Patient Advocate Specialist [facility] Antimicrobial Stewardship Team Direct Number: [phone]  Fax: [phone]

## 2020-08-24 NOTE — Progress Notes (Signed)
[facility] PROGRESS NOTE    Progress Note  Jodi Barr  OIZ:[id number] DOB: 1932-10-26 DOA: 08/17/2020 PCP: Geoffry Paradise, MD     Brief Narrative:   Jodi Barr is an 87 y.o. female past medical history of essential hypertension hyperlipidemia chronic T12 compression, constipation comes into the hospital she was found to be unresponsive.  Patient was having back pain and started on muscle relaxant as an outpatient.  In the ED was found to have left-sided weakness and weakness effusion started on Keppra EEG was done that showed encephalopathy CT spine normal, CT angio of the head and neck no acute findings, CT of the head showed no acute intracranial abnormalities.    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   Blood pressure ranging 150s to 170s. Started on low-dose Norvasc, continue hydrochlorothiazide and Avapro as well as metoprolol her blood pressure slowly coming down mentation is improving. Keppra has been discontinued. Physical therapy evaluated the patient recommended skilled nursing facility.  New onset atrial fibrillation: Start coreg, to control heart < 100 Start Eliquis, no history of falls or bleeds. CHA2DS2-VASc Score = 6  The patient's score is based upon: CHF History: Yes HTN History: Yes Diabetes History: No Stroke History: No Vascular Disease History: Yes Age Score: 2 Gender Score: 1   Hypovolemic hyponatremia: Probably contributing to seizures resolved with IV fluids.  We will go ahead and KVO IV fluids.  Leukocytosis: With a T-max of 98.3 no clear source of infection question stress margination in the setting of seizures for blood cell trending down off antibiotics culture data has remained negative.  Essential hypertension: Significantly elevated today, she was restarted back on her home medications.  Hyperlipidemia: Continue  statins.  Hypomagnesemia: Repleted try to keep greater than 2.  Lactic acidosis:  in the setting of seizures and dehydration now resolved.  History of T12 compression fracture: Avoid narcotics we will get physical therapy on board.    DVT prophylaxis: lovenox Family Communication:none Status is: Inpatient  Remains inpatient appropriate because:Hemodynamically unstable  Dispo: The patient is from: Home              Anticipated d/c is to: SNF              Patient currently is not medically stable to d/c.   Difficult to place patient No      Code Status:     Code Status Orders  (From admission, onward)           Start     Ordered   08/17/20 1338  Full code  Continuous        08/17/20 1342           Code Status History     This patient has a current code status but no historical code status.         IV Access:   Peripheral IV   Procedures and diagnostic studies:   No results found.   Medical Consultants:   None.   Subjective:    [name] back is improved tolerated her diet.  Objective:    Vitals:   08/24/20 0402 08/24/20 0523 08/24/20 0714 08/24/20 0823  BP: (!) 182/64 (!) 157/75 (!) 156/58 131/77  Pulse: 64  64 93  Resp: 17  20   Temp: 98.2 F (36.8 C)   98.2 F (36.8 C)  TempSrc: Oral   Oral  SpO2: 100%  100% 99%  Weight:       SpO2: 99 % O2 Flow Rate (L/min): 3 L/min   Intake/Output Summary (Last 24 hours) at 08/24/2020 0849 Last data filed at 08/24/2020 0430 Gross per 24 hour  Intake 360 ml  Output 650 ml  Net -290 ml    Filed Weights   08/17/20 0900  Weight: 53 kg    Exam: General exam: In no acute distress. Respiratory system: Good air movement and clear to auscultation. Cardiovascular system: S1 & S2 heard, RRR. No JVD. Gastrointestinal system: Abdomen is nondistended, soft and nontender.  Extremities: No pedal edema. Skin: No rashes, lesions or ulcers  Data Reviewed:    Labs: Basic  Metabolic Panel: Recent Labs  Lab 08/17/20 0900 08/17/20 0914 08/17/20 1450 08/18/20 0148 08/19/20 0828 08/24/20 0503  NA 121* 121* 128* 129* 131*  --   K 3.9 3.9 3.0* 3.6 3.7  --   CL 88* 88* 94* 92* 102  --   CO2 19*  --  23 23 15*  --   GLUCOSE 178* 176* 140* 113* 99  --   BUN 14 14 11 11 23   --   CREATININE 1.08* 0.90 0.84 0.92 0.98 0.79  CALCIUM 9.1  --  8.2* 8.7* 8.5*  --   MG  --   --  1.4*  --   --   --     GFR CrCl cannot be calculated (Unknown ideal weight.). Liver Function Tests: Recent Labs  Lab 08/17/20 0900 08/18/20 0148  AST 44* 58*  ALT 32 35  ALKPHOS 77 69  BILITOT 0.9 1.0  PROT 6.4* 5.7*  ALBUMIN 3.6 3.1*    No results for input(s): LIPASE, AMYLASE in the last 168 hours. No results for input(s): AMMONIA in the last 168 hours. Coagulation profile Recent Labs  Lab 08/17/20 0900  INR 1.0    COVID-19 Labs  No results for input(s): DDIMER, FERRITIN, LDH, CRP in the last 72 hours.  Lab Results  Component Value Date   SARSCOV2NAA NEGATIVE 08/23/2020   SARSCOV2NAA NEGATIVE 08/17/2020    CBC: Recent Labs  Lab 08/17/20 0900 08/17/20 0914 08/17/20 1450 08/18/20 0148 08/19/20 0828  WBC 20.0*  --  19.0* 16.2* 14.0*  NEUTROABS 17.7*  --  16.6* 13.6* 11.7*  HGB 13.3 13.9 13.0 14.3 12.3  HCT 39.1 41.0 37.4 40.8 36.9  MCV 93.8  --  91.4 90.9 95.3  PLT 371  --  348 323 284    Cardiac Enzymes: Recent Labs  Lab 08/17/20 1450  CKTOTAL 964*    BNP (last 3 results) No results for input(s): PROBNP in the last 8760 hours. CBG: Recent Labs  Lab 08/17/20 0858  GLUCAP 193*    D-Dimer: No results for input(s): DDIMER in the last 72 hours. Hgb A1c: No results for input(s): HGBA1C in the last 72 hours. Lipid Profile: No results for input(s): CHOL, HDL, LDLCALC, TRIG, CHOLHDL, LDLDIRECT in the last 72 hours.  Thyroid function studies: No results for input(s): TSH, T4TOTAL, T3FREE, THYROIDAB in the last 72 hours.  Invalid input(s):  FREET3  Anemia work up: No results for input(s): VITAMINB12, FOLATE, FERRITIN, TIBC, IRON, RETICCTPCT in the last 72 hours.  Sepsis Labs: Recent Labs  Lab 08/17/20 0900 08/17/20 1347 08/17/20 1450 08/17/20 1757 08/17/20 2206 08/18/20 0148 08/19/20 0828  WBC 20.0*  --  19.0*  --   --  16.2* 14.0*  LATICACIDVEN  --  1.7  --  2.4* 2.2*  --  1.9  Microbiology Recent Results (from the past 240 hour(s))  Resp Panel by RT-PCR (Flu A&B, Covid) Nasopharyngeal Swab     Status: None   Collection Time: 08/17/20 10:22 AM   Specimen: Nasopharyngeal Swab; Nasopharyngeal(NP) swabs in vial transport medium  Result Value Ref Range Status   SARS Coronavirus 2 by RT PCR NEGATIVE NEGATIVE Final    Comment: (NOTE) SARS-CoV-2 target nucleic acids are NOT DETECTED.  The SARS-CoV-2 RNA is generally detectable in upper respiratory specimens during the acute phase of infection. The lowest concentration of SARS-CoV-2 viral copies this assay can detect is 138 copies/mL. A negative result does not preclude SARS-Cov-2 infection and should not be used as the sole basis for treatment or other patient management decisions. A negative result may occur with  improper specimen collection/handling, submission of specimen other than nasopharyngeal swab, presence of viral mutation(s) within the areas targeted by this assay, and inadequate number of viral copies(<138 copies/mL). A negative result must be combined with clinical observations, patient history, and epidemiological information. The expected result is Negative.  Fact Sheet for Patients:  BloggerCourse.com  Fact Sheet for Healthcare Providers:  SeriousBroker.it  This test is no t yet approved or cleared by the [place] FDA and  has been authorized for detection and/or diagnosis of SARS-CoV-2 by FDA under an Emergency Use Authorization (EUA). This EUA will remain  in effect (meaning this test  can be used) for the duration of the COVID-19 declaration under Section 564(b)(1) of the Act, 21 U.S.C.section 360bbb-3(b)(1), unless the authorization is terminated  or revoked sooner.       Influenza A by PCR NEGATIVE NEGATIVE Final   Influenza B by PCR NEGATIVE NEGATIVE Final    Comment: (NOTE) The Xpert Xpress SARS-CoV-2/FLU/RSV plus assay is intended as an aid in the diagnosis of influenza from Nasopharyngeal swab specimens and should not be used as a sole basis for treatment. Nasal washings and aspirates are unacceptable for Xpert Xpress SARS-CoV-2/FLU/RSV testing.  Fact Sheet for Patients: BloggerCourse.com  Fact Sheet for Healthcare Providers: SeriousBroker.it  This test is not yet approved or cleared by the [place] FDA and has been authorized for detection and/or diagnosis of SARS-CoV-2 by FDA under an Emergency Use Authorization (EUA). This EUA will remain in effect (meaning this test can be used) for the duration of the COVID-19 declaration under Section 564(b)(1) of the Act, 21 U.S.C. section 360bbb-3(b)(1), unless the authorization is terminated or revoked.  Performed at [facility], [address], [place], [place] [address]   Culture, blood (routine x 2)     Status: None   Collection Time: 08/17/20  1:20 PM   Specimen: BLOOD  Result Value Ref Range Status   Specimen Description BLOOD RIGHT ANTECUBITAL  Final   Special Requests   Final    BOTTLES DRAWN AEROBIC AND ANAEROBIC Blood Culture results may not be optimal due to an inadequate volume of blood received in culture bottles   Culture   Final    NO GROWTH 5 DAYS Performed at [facility], [address], [place], [place] [address]    Report Status 08/22/2020 FINAL  Final  Culture, blood (routine x 2)     Status: None   Collection Time: 08/17/20  2:50 PM   Specimen: BLOOD  Result Value Ref Range Status   Specimen Description BLOOD  RIGHT ANTECUBITAL  Final   Special Requests   Final    BOTTLES DRAWN AEROBIC AND ANAEROBIC Blood Culture results may not  be optimal due to an inadequate volume of blood received in culture bottles   Culture   Final    NO GROWTH 5 DAYS Performed at [facility], [address] [place], [place] [address]    Report Status 08/22/2020 FINAL  Final  SARS CORONAVIRUS 2 (TAT 6-24 HRS) Nasopharyngeal Nasopharyngeal Swab     Status: None   Collection Time: 08/23/20 12:58 PM   Specimen: Nasopharyngeal Swab  Result Value Ref Range Status   SARS Coronavirus 2 NEGATIVE NEGATIVE Final    Comment: (NOTE) SARS-CoV-2 target nucleic acids are NOT DETECTED.  The SARS-CoV-2 RNA is generally detectable in upper and lower respiratory specimens during the acute phase of infection. Negative results do not preclude SARS-CoV-2 infection, do not rule out co-infections with other pathogens, and should not be used as the sole basis for treatment or other patient management decisions. Negative results must be combined with clinical observations, patient history, and epidemiological information. The expected result is Negative.  Fact Sheet for Patients: HairSlick.nohttps://www.fda.gov/media/138098/download  Fact Sheet for Healthcare Providers: quierodirigir.comhttps://www.fda.gov/media/138095/download  This test is not yet approved or cleared by the [place] FDA and  has been authorized for detection and/or diagnosis of SARS-CoV-2 by FDA under an Emergency Use Authorization (EUA). This EUA will remain  in effect (meaning this test can be used) for the duration of the COVID-19 declaration under Se ction 564(b)(1) of the Act, 21 U.S.C. section 360bbb-3(b)(1), unless the authorization is terminated or revoked sooner.  Performed at [facility], [address] [place], [place] [address]      Medications:    acetaminophen  1,000 mg Oral Q8H   amLODipine  2.5 mg Oral Daily   atorvastatin  40 mg Per Tube Daily    Chlorhexidine Gluconate Cloth  6 each Topical Daily   enoxaparin (LOVENOX) injection  40 mg Subcutaneous Q24H   hydrochlorothiazide  12.5 mg Oral Daily   irbesartan  300 mg Oral Daily   lidocaine  1 patch Transdermal Q24H   metoprolol tartrate  25 mg Oral BID   Continuous Infusions:  sodium chloride 10 mL/hr at 08/24/20 0028      LOS: 7 days   Marinda Elkbraham Feliz Ortiz  [facility]  08/24/2020, 8:49 AM

## 2020-08-24 NOTE — Progress Notes (Signed)
ANTICOAGULATION CONSULT NOTE - Initial Consult  Pharmacy Consult for Apixaban Indication: atrial fibrillation  Allergies  Allergen Reactions   Aspirin     REACTION: shortness of breath   Diclofenac Sodium     REACTION: Fluttering heart   Diltiazem Hcl     REACTION: Headache   Omeprazole    Penicillins     REACTION: Headache   Prednisone     REACTION: stomach pain, headache   Tramadol Hcl     REACTION: does not remember    Patient Measurements: Weight: 53 kg (116 lb 13.5 oz)  Vital Signs: Temp: 98.2 F (36.8 C) (06/17 0823) Temp Source: Oral (06/17 0823) BP: 131/77 (06/17 0823) Pulse Rate: 93 (06/17 0823)  Labs: Recent Labs    08/24/20 0503  CREATININE 0.79    CrCl cannot be calculated (Unknown ideal weight.).   Medical History: Past Medical History:  Diagnosis Date   Allergies    Arthritis    Hypertension     Medications:  Scheduled:   acetaminophen  1,000 mg Oral Q8H   amLODipine  5 mg Oral Daily   apixaban  2.5 mg Oral BID   atorvastatin  40 mg Oral Daily   carvedilol  12.5 mg Oral BID WC   Chlorhexidine Gluconate Cloth  6 each Topical Daily   hydrochlorothiazide  12.5 mg Oral Daily   irbesartan  300 mg Oral Daily   lidocaine  1 patch Transdermal Q24H    Assessment: 87 yo F to start apixaban for new onset afib.  Age >80, weight <60 kg qualifies patient for reduced dose.  Goal of Therapy:  Therapeutic anticoagulation   Plan:  Apixaban 2.5mg  PO BID  [employer], Pharm.D., BCPS Clinical Pharmacist Clinical phone for 08/24/2020 from 7:30-3:00 is [phone].  **Pharmacist phone directory can be found on [email or web address] listed under [facility].  08/24/2020 9:54 AM

## 2020-08-25 MED ORDER — OXYCODONE HCL 5 MG PO TABS
5.0000 mg | ORAL_TABLET | Freq: Four times a day (QID) | ORAL | Status: DC | PRN
  Administered 2020-08-25 – 2020-08-26 (×2): 5 mg via ORAL
  Filled 2020-08-25 (×2): qty 1

## 2020-08-25 MED ORDER — HYDROCODONE-ACETAMINOPHEN 5-325 MG PO TABS
1.0000 | ORAL_TABLET | ORAL | Status: DC | PRN
  Administered 2020-08-25: 1 via ORAL
  Filled 2020-08-25: qty 1

## 2020-08-25 MED ORDER — HALOPERIDOL LACTATE 5 MG/ML IJ SOLN: 1.0000 mg | Freq: Four times a day (QID) | INTRAMUSCULAR | Status: DC | PRN

## 2020-08-25 MED ORDER — ONDANSETRON HCL 4 MG/2ML IJ SOLN
4.0000 mg | Freq: Four times a day (QID) | INTRAMUSCULAR | Status: DC | PRN
  Administered 2020-08-25: 4 mg via INTRAVENOUS
  Filled 2020-08-25: qty 2

## 2020-08-25 MED ORDER — APIXABAN 2.5 MG PO TABS
2.5000 mg | ORAL_TABLET | Freq: Two times a day (BID) | ORAL | Status: DC
  Administered 2020-08-25 – 2020-08-29 (×9): 2.5 mg via ORAL
  Filled 2020-08-25 (×9): qty 1

## 2020-08-25 MED ORDER — HYDROCHLOROTHIAZIDE 25 MG PO TABS
25.0000 mg | ORAL_TABLET | Freq: Every day | ORAL | Status: DC
  Administered 2020-08-25 – 2020-08-29 (×5): 25 mg via ORAL
  Filled 2020-08-25 (×5): qty 1

## 2020-08-25 NOTE — Progress Notes (Signed)
[facility] PROGRESS NOTE    Progress Note  Jodi Barr  QWQ:[id number] DOB: Feb 22, 1933 DOA: 08/17/2020 PCP: Geoffry Paradise, MD     Brief Narrative:   Jodi Barr is an 87 y.o. female past medical history of essential hypertension hyperlipidemia chronic T12 compression, constipation comes into the hospital she was found to be unresponsive.  Patient was having back pain and started on muscle relaxant as an outpatient.  In the ED was found to have left-sided weakness and weakness effusion started on Keppra EEG was done that showed encephalopathy CT spine normal, CT angio of the head and neck no acute findings, CT of the head showed no acute intracranial abnormalities.    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   Blood pressure still elevated in the high 150s to 170s, will increase Norvasc and hydrochlorothiazide continue Avapro and Coreg at current dose.  Hydralazine as needed. Physical therapy evaluated the patient recommended skilled nursing facility.  New onset atrial fibrillation: Started coreg, heart rate< 100 Cont Eliquis, no history of falls or bleeds. CHA2DS2-VASc Score = 6   Hypovolemic hyponatremia: Now resolved KVO IV fluids.  Leukocytosis: With a T-max of 98.3 no clear source of infection question stress margination in the setting of seizures for blood cell trending down off antibiotics culture data has remained negative.  Essential hypertension: Significantly elevated today, she was restarted back on her home medications.  Hyperlipidemia: Continue statins.  Hypomagnesemia: Repleted try to keep greater than 2.  Lactic acidosis:  in the setting of seizures and dehydration now resolved.  History of T12 compression fracture: Avoid narcotics continue tramadol.     DVT prophylaxis: lovenox Family Communication:none Status is: Inpatient  Remains  inpatient appropriate because:Hemodynamically unstable  Dispo: The patient is from: Home              Anticipated d/c is to: SNF              Patient currently is not medically stable to d/c.   Difficult to place patient No      Code Status:     Code Status Orders  (From admission, onward)           Start     Ordered   08/17/20 1338  Full code  Continuous        08/17/20 1342           Code Status History     This patient has a current code status but no historical code status.         IV Access:   Peripheral IV   Procedures and diagnostic studies:   No results found.   Medical Consultants:   None.   Subjective:    Jodi Barr complaining of back pain.  Objective:    Vitals:   08/24/20 1933 08/24/20 2322 08/25/20 0337 08/25/20 0545  BP: 123/64 (!) 152/93 (!) 171/86 124/61  Pulse: 84 83 91 94  Resp: 19 19 18 18   Temp: 98 F (36.7 C) 97.8 F (36.6 C) 97.9 F (36.6 C)   TempSrc: Oral  Oral   SpO2: 97% 98% 97% 98%  Weight:       SpO2: 98 % O2 Flow Rate (L/min): 2 L/min   Intake/Output Summary (Last 24 hours) at 08/25/2020 0703 Last data filed at 08/24/2020 1051 Gross per 24 hour  Intake --  Output 250 ml  Net -250 ml  Filed Weights   08/17/20 0900  Weight: 53 kg    Exam: General exam: In no acute distress. Respiratory system: Good air movement and clear to auscultation. Cardiovascular system: S1 & S2 heard, RRR. No JVD. Gastrointestinal system: Abdomen is nondistended, soft and nontender.  Extremities: No pedal edema. Skin: No rashes, lesions or ulcers  Data Reviewed:    Labs: Basic Metabolic Panel: Recent Labs  Lab 08/19/20 0828 08/24/20 0503  NA 131*  --   K 3.7  --   CL 102  --   CO2 15*  --   GLUCOSE 99  --   BUN 23  --   CREATININE 0.98 0.79  CALCIUM 8.5*  --     GFR CrCl cannot be calculated (Unknown ideal weight.). Liver Function Tests: No results for input(s): AST, ALT, ALKPHOS, BILITOT,  PROT, ALBUMIN in the last 168 hours.  No results for input(s): LIPASE, AMYLASE in the last 168 hours. No results for input(s): AMMONIA in the last 168 hours. Coagulation profile No results for input(s): INR, PROTIME in the last 168 hours.  COVID-19 Labs  No results for input(s): DDIMER, FERRITIN, LDH, CRP in the last 72 hours.  Lab Results  Component Value Date   SARSCOV2NAA NEGATIVE 08/23/2020   SARSCOV2NAA NEGATIVE 08/17/2020    CBC: Recent Labs  Lab 08/19/20 0828  WBC 14.0*  NEUTROABS 11.7*  HGB 12.3  HCT 36.9  MCV 95.3  PLT 284    Cardiac Enzymes: No results for input(s): CKTOTAL, CKMB, CKMBINDEX, TROPONINI in the last 168 hours.  BNP (last 3 results) No results for input(s): PROBNP in the last 8760 hours. CBG: No results for input(s): GLUCAP in the last 168 hours.  D-Dimer: No results for input(s): DDIMER in the last 72 hours. Hgb A1c: No results for input(s): HGBA1C in the last 72 hours. Lipid Profile: No results for input(s): CHOL, HDL, LDLCALC, TRIG, CHOLHDL, LDLDIRECT in the last 72 hours.  Thyroid function studies: No results for input(s): TSH, T4TOTAL, T3FREE, THYROIDAB in the last 72 hours.  Invalid input(s): FREET3  Anemia work up: No results for input(s): VITAMINB12, FOLATE, FERRITIN, TIBC, IRON, RETICCTPCT in the last 72 hours.  Sepsis Labs: Recent Labs  Lab 08/19/20 0828  WBC 14.0*  LATICACIDVEN 1.9    Microbiology Recent Results (from the past 240 hour(s))  Resp Panel by RT-PCR (Flu A&B, Covid) Nasopharyngeal Swab     Status: None   Collection Time: 08/17/20 10:22 AM   Specimen: Nasopharyngeal Swab; Nasopharyngeal(NP) swabs in vial transport medium  Result Value Ref Range Status   SARS Coronavirus 2 by RT PCR NEGATIVE NEGATIVE Final    Comment: (NOTE) SARS-CoV-2 target nucleic acids are NOT DETECTED.  The SARS-CoV-2 RNA is generally detectable in upper respiratory specimens during the acute phase of infection. The  lowest concentration of SARS-CoV-2 viral copies this assay can detect is 138 copies/mL. A negative result does not preclude SARS-Cov-2 infection and should not be used as the sole basis for treatment or other patient management decisions. A negative result may occur with  improper specimen collection/handling, submission of specimen other than nasopharyngeal swab, presence of viral mutation(s) within the areas targeted by this assay, and inadequate number of viral copies(<138 copies/mL). A negative result must be combined with clinical observations, patient history, and epidemiological information. The expected result is Negative.  Fact Sheet for Patients:  BloggerCourse.comhttps://www.fda.gov/media/152166/download  Fact Sheet for Healthcare Providers:  SeriousBroker.ithttps://www.fda.gov/media/152162/download  This test is no t yet approved or cleared by the [place] FDA and  has been authorized for detection and/or diagnosis of SARS-CoV-2 by FDA under an Emergency Use Authorization (EUA). This EUA will remain  in effect (meaning this test can be used) for the duration of the COVID-19 declaration under Section 564(b)(1) of the Act, 21 U.S.C.section 360bbb-3(b)(1), unless the authorization is terminated  or revoked sooner.       Influenza A by PCR NEGATIVE NEGATIVE Final   Influenza B by PCR NEGATIVE NEGATIVE Final    Comment: (NOTE) The Xpert Xpress SARS-CoV-2/FLU/RSV plus assay is intended as an aid in the diagnosis of influenza from Nasopharyngeal swab specimens and should not be used as a sole basis for treatment. Nasal washings and aspirates are unacceptable for Xpert Xpress SARS-CoV-2/FLU/RSV testing.  Fact Sheet for Patients: BloggerCourse.com  Fact Sheet for Healthcare Providers: SeriousBroker.it  This test is not yet approved or cleared by the [place] FDA and has been authorized for detection and/or diagnosis of SARS-CoV-2 by FDA under  an Emergency Use Authorization (EUA). This EUA will remain in effect (meaning this test can be used) for the duration of the COVID-19 declaration under Section 564(b)(1) of the Act, 21 U.S.C. section 360bbb-3(b)(1), unless the authorization is terminated or revoked.  Performed at [facility], [address] [place], [place] [address]   Culture, blood (routine x 2)     Status: None   Collection Time: 08/17/20  1:20 PM   Specimen: BLOOD  Result Value Ref Range Status   Specimen Description BLOOD RIGHT ANTECUBITAL  Final   Special Requests   Final    BOTTLES DRAWN AEROBIC AND ANAEROBIC Blood Culture results may not be optimal due to an inadequate volume of blood received in culture bottles   Culture   Final    NO GROWTH 5 DAYS Performed at [facility], [address], [place], [place] [address]    Report Status 08/22/2020 FINAL  Final  Culture, blood (routine x 2)     Status: None   Collection Time: 08/17/20  2:50 PM   Specimen: BLOOD  Result Value Ref Range Status   Specimen Description BLOOD RIGHT ANTECUBITAL  Final   Special Requests   Final    BOTTLES DRAWN AEROBIC AND ANAEROBIC Blood Culture results may not be optimal due to an inadequate volume of blood received in culture bottles   Culture   Final    NO GROWTH 5 DAYS Performed at [facility], [address], [place], [place] [address]    Report Status 08/22/2020 FINAL  Final  SARS CORONAVIRUS 2 (TAT 6-24 HRS) Nasopharyngeal Nasopharyngeal Swab     Status: None   Collection Time: 08/23/20 12:58 PM   Specimen: Nasopharyngeal Swab  Result Value Ref Range Status   SARS Coronavirus 2 NEGATIVE NEGATIVE Final    Comment: (NOTE) SARS-CoV-2 target nucleic acids are NOT DETECTED.  The SARS-CoV-2 RNA is generally detectable in upper and lower respiratory specimens during the acute phase of infection. Negative results do not preclude SARS-CoV-2 infection, do not rule out co-infections with other pathogens,  and should not be used as the sole basis for treatment or other patient management decisions. Negative results must be combined with clinical observations, patient history, and epidemiological information. The expected result is Negative.  Fact Sheet for Patients: HairSlick.no  Fact Sheet for Healthcare Providers: quierodirigir.com  This test is not yet approved or cleared by the [place] FDA and  has been authorized for detection and/or diagnosis of SARS-CoV-2  by FDA under an Emergency Use Authorization (EUA). This EUA will remain  in effect (meaning this test can be used) for the duration of the COVID-19 declaration under Se ction 564(b)(1) of the Act, 21 U.S.C. section 360bbb-3(b)(1), unless the authorization is terminated or revoked sooner.  Performed at [facility], [address], [place], [place] [address]      Medications:    acetaminophen  1,000 mg Oral Q8H   amLODipine  5 mg Oral Daily   apixaban  2.5 mg Oral BID   atorvastatin  40 mg Oral Daily   carvedilol  12.5 mg Oral BID WC   Chlorhexidine Gluconate Cloth  6 each Topical Daily   DULoxetine  30 mg Oral Daily   hydrochlorothiazide  12.5 mg Oral Daily   irbesartan  300 mg Oral Daily   lidocaine  1 patch Transdermal Q24H   polyethylene glycol  17 g Oral BID   Continuous Infusions:  sodium chloride 10 mL/hr at 08/24/20 0028      LOS: 8 days   Marinda Elk  [facility]  08/25/2020, 7:03 AM

## 2020-08-25 NOTE — Progress Notes (Addendum)
Daily Progress Note   Patient Name: Jodi Barr       Date: 08/25/2020 DOB: 08/21/1932  Age: 87 y.o. MRN#: [id number] Attending Physician: Charlynne Cousins, MD Primary Care Physician: Burnard Bunting, MD Admit Date: 08/17/2020  Reason for Consultation/Follow-up: goals of care, symptom management  Subjective: Patient is resting, but awakens easily to voice. She reports her pain is improved compared to yesterday. She still reports it as 8/10 (down from 10/10) - however she appears more comfortable compared to yesterday and no non-verbal signs of pain noted.   Per MAR, she received 1 dose of tramadol yesterday evening and 1 dose of vicodin this morning. Per bedside RN, patient reported stomach ache after taking vicodin.   Length of Stay: 8  Current Medications: Scheduled Meds:  . amLODipine  5 mg Oral Daily  . apixaban  2.5 mg Oral BID  . atorvastatin  40 mg Oral Daily  . carvedilol  12.5 mg Oral BID WC  . Chlorhexidine Gluconate Cloth  6 each Topical Daily  . DULoxetine  30 mg Oral Daily  . hydrochlorothiazide  25 mg Oral Daily  . irbesartan  300 mg Oral Daily  . lidocaine  1 patch Transdermal Q24H  . polyethylene glycol  17 g Oral BID    Continuous Infusions: . sodium chloride 10 mL/hr at 08/24/20 0028    PRN Meds: haloperidol lactate, hydrALAZINE, Muscle Rub, ondansetron, oxyCODONE, QUEtiapine  Physical Exam Vitals reviewed.  Constitutional:      General: She is not in acute distress.    Appearance: She is ill-appearing.  Cardiovascular:     Rate and Rhythm: Normal rate.  Pulmonary:     Effort: Pulmonary effort is normal.  Neurological:     Mental Status: She is alert and oriented to person, place, and time.     Motor: Weakness present.            Vital  Signs: BP (!) 119/58   Pulse 79   Temp 97.9 F (36.6 C) (Oral)   Resp 16   Wt 53 kg   SpO2 98%   BMI 20.70 kg/m  SpO2: SpO2: 98 % O2 Device: O2 Device: Nasal Cannula O2 Flow Rate: O2 Flow Rate (L/min): 1 L/min  Intake/output summary:  Intake/Output Summary (Last 24 hours) at 08/25/2020 1913 Last data filed  at 08/25/2020 1400 Gross per 24 hour  Intake --  Output 350 ml  Net -350 ml   LBM: Last BM Date:  (unsure) Baseline Weight: Weight: 53 kg Most recent weight: Weight: 53 kg       Palliative Assessment/Data: PPS 40%      Palliative Care Assessment & Plan   HPI/Patient Profile: 87 y.o. female  with past medical history of osteoarthritis, hypertension, hyperlipidemia, GERD, and chronic T12 compression fracture. She presented to the emergency department on 08/17/20 with altered mental status. She had reportedly been having back pain and was prescribed a muscle relaxer. On the morning of 08/17/20, she called her neighbor and asked him to come over because she was in pain. When he went to her house he found she had fallen down by her bed so he called EMS. When EMS arrived, patient was not confused but then had a witnessed seizure and left sided weakness in route. On arrival to the ED, patient was altered and non-verbal. Code stroke was called and CT head was negative. CT spine negative. CTA head and neck was negative for large vessel occlusion. Bedside EEG showed moderate diffuse encephalopathy nonspecific etiology but no seizures or epileptiform discharges seen.    Assessment: - acute metabolic encephalopathy, weakness, seizure (probably due to PRES) - hypovolemic hyponatremia - leukocytosis - lactic acidosis (resolved) - acute on chronic back pain, history of T12 compression fracture    Recommendations/Plan: Start oxycodone IR 5 mg every 6 hours as needed for pain Will discontinue Vicodin due to stomach upset Will not re-start tramadol due to hyponatremia   Goals of Care  and Additional Recommendations (from MOST completed 08/24/20):  Cardiopulmonary Resuscitation: Do Not Attempt Resuscitation (DNR/No CPR)  Medical Interventions: Comfort Measures: Keep clean, warm, and dry. Use medication by any route, positioning, wound care, and other measures to relieve pain and suffering. Use oxygen, suction and manual treatment of airway obstruction as needed for comfort. Do not transfer to the hospital unless comfort needs cannot be met in current location.  Antibiotics: Antibiotics if indicated  IV Fluids: IV fluids for a defined trial period  Feeding Tube: No feeding tube    Code Status: DNR/DNI  Prognosis:  Unable to determine  Discharge Planning: [place] for rehab with Palliative care service follow-up  Care plan was discussed with attending MD and bedside RN  Thank you for allowing the Palliative Medicine Team to assist in the care of this patient.   Total Time 25 minutes Prolonged Time Billed  no       Greater than 50%  of this time was spent counseling and coordinating care related to the above assessment and plan.  Lavena Bullion, NP  Please contact Palliative Medicine Team phone at [phone] for questions and concerns.

## 2020-08-25 NOTE — TOC Progression Note (Signed)
Transition of Care [facility]) - Progression Note    Patient Details  Name: Jodi Barr MRN: [id number] Date of Birth: [birth year]  [facility]) CM/SW Contact  Carley Hammed, [place] Phone Number: 08/25/2020, 11:06 AM  Clinical Narrative:    CSW spoke with pt's POA [name], to see if they had picked a rehab yet. He noted they were still in the process at this time. He stated he would call CSW back with any updates or questions. CSW will continue to follow for DC needs.   Expected Discharge Plan: Skilled Nursing Facility Barriers to Discharge: Continued Medical Work up, English as a second language teacher  Expected Discharge Plan and Services Expected Discharge Plan: Skilled Nursing Facility     Post Acute Care Choice: Skilled Nursing Facility Living arrangements for the past 2 months: Single Family Home                                       Social Determinants of Health (SDOH) Interventions    Readmission Risk Interventions No flowsheet data found.

## 2020-08-26 MED ORDER — MORPHINE SULFATE (PF) 4 MG/ML IV SOLN
4.0000 mg | INTRAVENOUS | Status: DC | PRN
  Administered 2020-08-26: 4 mg via INTRAVENOUS
  Filled 2020-08-26: qty 1

## 2020-08-26 MED ORDER — OXYCODONE HCL 5 MG PO TABS
5.0000 mg | ORAL_TABLET | ORAL | Status: DC | PRN
  Administered 2020-08-26: 5 mg via ORAL
  Filled 2020-08-26: qty 1

## 2020-08-26 MED ORDER — CARVEDILOL 12.5 MG PO TABS
25.0000 mg | ORAL_TABLET | Freq: Two times a day (BID) | ORAL | Status: DC
  Administered 2020-08-26 – 2020-08-29 (×7): 25 mg via ORAL
  Filled 2020-08-26 (×7): qty 2

## 2020-08-26 NOTE — Progress Notes (Signed)
Pt bladder scanned at 0500, read , pt voided last at 2145, in and out cath done at 0515, drained out of clear urine, peri care done and pt made comfortable in bed, will however continue to monitor. Obasogie-Asidi, Jivan Symanski Efe

## 2020-08-26 NOTE — Progress Notes (Signed)
[facility] PROGRESS NOTE    Progress Note  Jodi Barr  LOV:[id number] DOB: Aug 19, 1932 DOA: 08/17/2020 PCP: Geoffry Paradise, MD     Brief Narrative:   Jodi Barr is an 87 y.o. female past medical history of essential hypertension hyperlipidemia chronic T12 compression, constipation comes into the hospital she was found to be unresponsive.  Patient was having back pain and started on muscle relaxant as an outpatient.  In the ED was found to have left-sided weakness and weakness effusion started on Keppra EEG was done that showed encephalopathy CT spine normal, CT angio of the head and neck no acute findings, CT of the head showed no acute intracranial abnormalities.  She is awaiting placement to skilled nursing facility.  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   Blood pressure slowly improving highest was systolic 160s  Continue Norvasc, hydralazine, Avapro and increase Coreg continue titrate antihypertensive medication. Continue IV hydralazine as needed. Physical therapy evaluated the patient recommended skilled nursing facility.  New onset atrial fibrillation: Started coreg, heart rate< 100 Cont Eliquis, no history of falls or bleeds. CHA2DS2-VASc Score = 6   Hypovolemic hyponatremia: Now resolved KVO IV fluids.  Leukocytosis: With a T-max of 98.3 no clear source of infection question stress margination in the setting of seizures for blood cell trending down off antibiotics culture data has remained negative.  Essential hypertension: Significantly improved blood pressure is slowly improving coming down. See above for further details.  Hyperlipidemia: Continue statins.  Hypomagnesemia: Repleted try to keep greater than 2.  Lactic acidosis:  in the setting of seizures and dehydration now resolved.  History of T12 compression fracture: Avoid narcotics continue  tramadol.     DVT prophylaxis: lovenox Family Communication:none Status is: Inpatient  Remains inpatient appropriate because:Hemodynamically unstable  Dispo: The patient is from: Home              Anticipated d/c is to: SNF              Patient currently is not medically stable to d/c.   Difficult to place patient No      Code Status:     Code Status Orders  (From admission, onward)           Start     Ordered   08/17/20 1338  Full code  Continuous        08/17/20 1342           Code Status History     This patient has a current code status but no historical code status.         IV Access:   Peripheral IV   Procedures and diagnostic studies:   No results found.   Medical Consultants:   None.   Subjective:    Jodi Barr relates she hurts all over, that her nursing home will be the death of her  Objective:    Vitals:   08/25/20 2006 08/26/20 0000 08/26/20 0401 08/26/20 0725  BP: 114/63 133/68 122/79 (!) 158/70  Pulse: 79 78 70 78  Resp: 16 16  16   Temp: 98.2 F (36.8 C) 98 F (36.7 C) 97.7 F (36.5 C) 97.7 F (36.5 C)  TempSrc: Oral Oral Oral Oral  SpO2: 99% 99% 98% 98%  Weight:       SpO2: 98 % O2 Flow Rate (L/min): 2 L/min   Intake/Output Summary (Last 24 hours) at 08/26/2020 0841 Last  data filed at 08/26/2020 0515 Gross per 24 hour  Intake --  Output 600 ml  Net -600 ml    Filed Weights   08/17/20 0900  Weight: 53 kg    Exam: General exam: In no acute distress. Respiratory system: Good air movement and clear to auscultation. Cardiovascular system: S1 & S2 heard, RRR. No JVD. Gastrointestinal system: Abdomen is nondistended, soft and nontender.  Extremities: No pedal edema. Skin: No rashes, lesions or ulcers  Data Reviewed:    Labs: Basic Metabolic Panel: Recent Labs  Lab 08/24/20 0503  CREATININE 0.79    GFR CrCl cannot be calculated (Unknown ideal weight.). Liver Function Tests: No  results for input(s): AST, ALT, ALKPHOS, BILITOT, PROT, ALBUMIN in the last 168 hours.  No results for input(s): LIPASE, AMYLASE in the last 168 hours. No results for input(s): AMMONIA in the last 168 hours. Coagulation profile No results for input(s): INR, PROTIME in the last 168 hours.  COVID-19 Labs  No results for input(s): DDIMER, FERRITIN, LDH, CRP in the last 72 hours.  Lab Results  Component Value Date   SARSCOV2NAA NEGATIVE 08/23/2020   SARSCOV2NAA NEGATIVE 08/17/2020    CBC: No results for input(s): WBC, NEUTROABS, HGB, HCT, MCV, PLT in the last 168 hours.  Cardiac Enzymes: No results for input(s): CKTOTAL, CKMB, CKMBINDEX, TROPONINI in the last 168 hours.  BNP (last 3 results) No results for input(s): PROBNP in the last 8760 hours. CBG: No results for input(s): GLUCAP in the last 168 hours.  D-Dimer: No results for input(s): DDIMER in the last 72 hours. Hgb A1c: No results for input(s): HGBA1C in the last 72 hours. Lipid Profile: No results for input(s): CHOL, HDL, LDLCALC, TRIG, CHOLHDL, LDLDIRECT in the last 72 hours.  Thyroid function studies: No results for input(s): TSH, T4TOTAL, T3FREE, THYROIDAB in the last 72 hours.  Invalid input(s): FREET3  Anemia work up: No results for input(s): VITAMINB12, FOLATE, FERRITIN, TIBC, IRON, RETICCTPCT in the last 72 hours.  Sepsis Labs: No results for input(s): PROCALCITON, WBC, LATICACIDVEN in the last 168 hours.  Microbiology Recent Results (from the past 240 hour(s))  Resp Panel by RT-PCR (Flu A&B, Covid) Nasopharyngeal Swab     Status: None   Collection Time: 08/17/20 10:22 AM   Specimen: Nasopharyngeal Swab; Nasopharyngeal(NP) swabs in vial transport medium  Result Value Ref Range Status   SARS Coronavirus 2 by RT PCR NEGATIVE NEGATIVE Final    Comment: (NOTE) SARS-CoV-2 target nucleic acids are NOT DETECTED.  The SARS-CoV-2 RNA is generally detectable in upper respiratory specimens during the acute  phase of infection. The lowest concentration of SARS-CoV-2 viral copies this assay can detect is 138 copies/mL. A negative result does not preclude SARS-Cov-2 infection and should not be used as the sole basis for treatment or other patient management decisions. A negative result may occur with  improper specimen collection/handling, submission of specimen other than nasopharyngeal swab, presence of viral mutation(s) within the areas targeted by this assay, and inadequate number of viral copies(<138 copies/mL). A negative result must be combined with clinical observations, patient history, and epidemiological information. The expected result is Negative.  Fact Sheet for Patients:  BloggerCourse.com  Fact Sheet for Healthcare Providers:  SeriousBroker.it  This test is no t yet approved or cleared by the [place] FDA and  has been authorized for detection and/or diagnosis of SARS-CoV-2 by FDA under an Emergency Use Authorization (EUA). This EUA will remain  in effect (meaning this test can be used) for  the duration of the COVID-19 declaration under Section 564(b)(1) of the Act, 21 U.S.C.section 360bbb-3(b)(1), unless the authorization is terminated  or revoked sooner.       Influenza A by PCR NEGATIVE NEGATIVE Final   Influenza B by PCR NEGATIVE NEGATIVE Final    Comment: (NOTE) The Xpert Xpress SARS-CoV-2/FLU/RSV plus assay is intended as an aid in the diagnosis of influenza from Nasopharyngeal swab specimens and should not be used as a sole basis for treatment. Nasal washings and aspirates are unacceptable for Xpert Xpress SARS-CoV-2/FLU/RSV testing.  Fact Sheet for Patients: BloggerCourse.com  Fact Sheet for Healthcare Providers: SeriousBroker.it  This test is not yet approved or cleared by the [place] FDA and has been authorized for detection and/or diagnosis of  SARS-CoV-2 by FDA under an Emergency Use Authorization (EUA). This EUA will remain in effect (meaning this test can be used) for the duration of the COVID-19 declaration under Section 564(b)(1) of the Act, 21 U.S.C. section 360bbb-3(b)(1), unless the authorization is terminated or revoked.  Performed at [facility], [address] [place], [place] [address]   Culture, blood (routine x 2)     Status: None   Collection Time: 08/17/20  1:20 PM   Specimen: BLOOD  Result Value Ref Range Status   Specimen Description BLOOD RIGHT ANTECUBITAL  Final   Special Requests   Final    BOTTLES DRAWN AEROBIC AND ANAEROBIC Blood Culture results may not be optimal due to an inadequate volume of blood received in culture bottles   Culture   Final    NO GROWTH 5 DAYS Performed at [facility], [address], [place], [place] [address]    Report Status 08/22/2020 FINAL  Final  Culture, blood (routine x 2)     Status: None   Collection Time: 08/17/20  2:50 PM   Specimen: BLOOD  Result Value Ref Range Status   Specimen Description BLOOD RIGHT ANTECUBITAL  Final   Special Requests   Final    BOTTLES DRAWN AEROBIC AND ANAEROBIC Blood Culture results may not be optimal due to an inadequate volume of blood received in culture bottles   Culture   Final    NO GROWTH 5 DAYS Performed at [facility], [address] [place], [place] [address]    Report Status 08/22/2020 FINAL  Final  SARS CORONAVIRUS 2 (TAT 6-24 HRS) Nasopharyngeal Nasopharyngeal Swab     Status: None   Collection Time: 08/23/20 12:58 PM   Specimen: Nasopharyngeal Swab  Result Value Ref Range Status   SARS Coronavirus 2 NEGATIVE NEGATIVE Final    Comment: (NOTE) SARS-CoV-2 target nucleic acids are NOT DETECTED.  The SARS-CoV-2 RNA is generally detectable in upper and lower respiratory specimens during the acute phase of infection. Negative results do not preclude SARS-CoV-2 infection, do not rule out co-infections  with other pathogens, and should not be used as the sole basis for treatment or other patient management decisions. Negative results must be combined with clinical observations, patient history, and epidemiological information. The expected result is Negative.  Fact Sheet for Patients: HairSlick.no  Fact Sheet for Healthcare Providers: quierodirigir.com  This test is not yet approved or cleared by the [place] FDA and  has been authorized for detection and/or diagnosis of SARS-CoV-2 by FDA under an Emergency Use Authorization (EUA). This EUA will remain  in effect (meaning this test can be used) for the duration of the COVID-19 declaration under Se ction 564(b)(1) of the Act,  21 U.S.C. section 360bbb-3(b)(1), unless the authorization is terminated or revoked sooner.  Performed at [facility], [address] [place], [place] [address]      Medications:    amLODipine  5 mg Oral Daily   apixaban  2.5 mg Oral BID   atorvastatin  40 mg Oral Daily   carvedilol  12.5 mg Oral BID WC   Chlorhexidine Gluconate Cloth  6 each Topical Daily   DULoxetine  30 mg Oral Daily   hydrochlorothiazide  25 mg Oral Daily   irbesartan  300 mg Oral Daily   lidocaine  1 patch Transdermal Q24H   polyethylene glycol  17 g Oral BID   Continuous Infusions:  sodium chloride 10 mL/hr at 08/24/20 0028      LOS: 9 days   Marinda Elkbraham Feliz Ortiz  [facility]  08/26/2020, 8:41 AM

## 2020-08-27 DIAGNOSIS — R569 Unspecified convulsions: Secondary | ICD-10-CM | POA: Diagnosis not present

## 2020-08-27 DIAGNOSIS — M545 Low back pain, unspecified: Secondary | ICD-10-CM | POA: Diagnosis not present

## 2020-08-27 DIAGNOSIS — R4182 Altered mental status, unspecified: Secondary | ICD-10-CM | POA: Diagnosis not present

## 2020-08-27 DIAGNOSIS — M549 Dorsalgia, unspecified: Secondary | ICD-10-CM

## 2020-08-27 DIAGNOSIS — R296 Repeated falls: Secondary | ICD-10-CM | POA: Diagnosis not present

## 2020-08-27 MED ORDER — POLYETHYLENE GLYCOL 3350 17 G PO PACK
17.0000 g | PACK | Freq: Two times a day (BID) | ORAL | Status: DC
  Administered 2020-08-27 (×2): 17 g via ORAL
  Filled 2020-08-27 (×2): qty 1

## 2020-08-27 MED ORDER — AMLODIPINE BESYLATE 10 MG PO TABS
10.0000 mg | ORAL_TABLET | Freq: Every day | ORAL | Status: DC
  Administered 2020-08-27: 10 mg via ORAL
  Filled 2020-08-27: qty 1

## 2020-08-27 MED ORDER — OXYCODONE HCL 5 MG PO TABS
10.0000 mg | ORAL_TABLET | ORAL | Status: DC | PRN
  Administered 2020-08-27 (×2): 10 mg via ORAL
  Filled 2020-08-27 (×2): qty 2

## 2020-08-27 NOTE — Discharge Instructions (Signed)

## 2020-08-27 NOTE — Progress Notes (Signed)
Daily Progress Note   Patient Name: Jodi Barr       Date: 08/27/2020 DOB: [birth year]  Age: 87 y.o. MRN#: [id number] Attending Physician: Charlynne Cousins, MD Primary Care Physician: Burnard Bunting, MD Admit Date: 08/17/2020  Reason for Consultation/Follow-up: goals of care, symptom management  Subjective: Per [facility], patient has received 2 doses of oxycodone IR in the last 24 hours.   Patient is resting with her eyes closed when I enter the room, but awakens easily voice. She reports her pain is improved, and states that God has "answered her prayers". She appears comfortable - no nonverbal signs of discomfort noted. I note that lunch tray is still in the room, appears she ate approximately 25%. She reports her appetite is still "not great", but feels that it has improved.   Length of Stay: 10  Current Medications: Scheduled Meds:  . amLODipine  10 mg Oral Daily  . apixaban  2.5 mg Oral BID  . atorvastatin  40 mg Oral Daily  . carvedilol  25 mg Oral BID WC  . Chlorhexidine Gluconate Cloth  6 each Topical Daily  . DULoxetine  30 mg Oral Daily  . hydrochlorothiazide  25 mg Oral Daily  . irbesartan  300 mg Oral Daily  . lidocaine  1 patch Transdermal Q24H  . polyethylene glycol  17 g Oral BID    Continuous Infusions: . sodium chloride 10 mL/hr at 08/24/20 0028    PRN Meds: haloperidol lactate, hydrALAZINE, morphine injection, Muscle Rub, ondansetron, oxyCODONE, QUEtiapine  Physical Exam Vitals reviewed.  Constitutional:      General: She is not in acute distress.    Appearance: She is ill-appearing.  Pulmonary:     Effort: Pulmonary effort is normal.  Neurological:     Mental Status: She is oriented to person, place, and time.     Motor: Weakness present.             Vital Signs: BP (!) 116/53 (BP Location: Left Arm)   Pulse 83   Temp 98.3 F (36.8 C) (Oral)   Resp 18   Wt 53 kg   SpO2 94%   BMI 20.70 kg/m  SpO2: SpO2: 94 % O2 Device: O2 Device: Room Air O2 Flow Rate: O2 Flow Rate (L/min): 1.5 L/min  Intake/output summary:  Intake/Output Summary (Last  24 hours) at 08/27/2020 1551 Last data filed at 08/27/2020 0515 Gross per 24 hour  Intake --  Output 950 ml  Net -950 ml   LBM: Last BM Date: 08/26/20 Baseline Weight: Weight: 53 kg Most recent weight: Weight: 53 kg       Palliative Assessment/Data: PPS 40%      Palliative Care Assessment & Plan   HPI/Patient Profile: 87 y.o. female  with past medical history of osteoarthritis, hypertension, hyperlipidemia, GERD, and chronic T12 compression fracture. She presented to the emergency department on 08/17/20 with altered mental status. She had reportedly been having back pain and was prescribed a muscle relaxer. On the morning of 08/17/20, she called her neighbor and asked him to come over because she was in pain. When he went to her house he found she had fallen down by her bed so he called EMS. When EMS arrived, patient was not confused but then had a witnessed seizure and left sided weakness in route. On arrival to the ED, patient was altered and non-verbal. Code stroke was called and CT head was negative. CT spine negative. CTA head and neck was negative for large vessel occlusion. Bedside EEG showed moderate diffuse encephalopathy nonspecific etiology but no seizures or epileptiform discharges seen.    Assessment: - acute metabolic encephalopathy, weakness, seizure (probably due to PRES) - hypovolemic hyponatremia - leukocytosis - lactic acidosis (resolved) - acute on chronic back pain, history of T12 compression fracture  Recommendations/Plan: Continue duloxetine (Cymbalta) 30 mg daily Continue Oxycodone IR 10 mg every 3 hours as needed Recommend outpatient palliative at  discharge  Goals of Care and Additional Recommendations (from MOST completed 08/24/20):   Cardiopulmonary Resuscitation: Do Not Attempt Resuscitation (DNR/No CPR)  Medical Interventions: Comfort Measures: Keep clean, warm, and dry. Use medication by any route, positioning, wound care, and other measures to relieve pain and suffering. Use oxygen, suction and manual treatment of airway obstruction as needed for comfort. Do not transfer to the hospital unless comfort needs cannot be met in current location.  Antibiotics: Antibiotics if indicated  IV Fluids: IV fluids for a defined trial period  Feeding Tube: No feeding tube     Code Status: DNR/DNI   Prognosis:  Unable to determine  Discharge Planning: [place] for rehab with Palliative care service follow-up    Thank you for allowing the Palliative Medicine Team to assist in the care of this patient.   Total Time 15 minutes Prolonged Time Billed  no       Greater than 50%  of this time was spent counseling and coordinating care related to the above assessment and plan.  Lavena Bullion, NP  Please contact Palliative Medicine Team phone at [phone] for questions and concerns.

## 2020-08-27 NOTE — TOC Progression Note (Signed)
Transition of Care [facility]) - Progression Note    Patient Details  Name: Jodi Barr MRN: [id number] Date of Birth: 1932-11-20  Transition [facility] CM/SW Contact  Baldemar Lenis, [place] Phone Number: 08/27/2020, 2:58 PM  Clinical Narrative:   CSW spoke with POA [name] about SNF options. Per [name], his wife is going to tour the SNF options today, and he will get back to CSW with choice. CSW discussed how MD is hopeful to discharge tomorrow and insurance will also need to be obtained, which can't be started until after facility is chosen. Lyle to follow back up with CSW on choice.     Expected Discharge Plan: Skilled Nursing Facility Barriers to Discharge: Continued Medical Work up, English as a second language teacher  Expected Discharge Plan and Services Expected Discharge Plan: Skilled Nursing Facility     Post Acute Care Choice: Skilled Nursing Facility Living arrangements for the past 2 months: Single Family Home                                       Social Determinants of Health (SDOH) Interventions    Readmission Risk Interventions No flowsheet data found.

## 2020-08-27 NOTE — Progress Notes (Signed)
This chaplain is present for F/U spiritual care.  The Pt. is sleeping and wakes to the call of her name. The chaplain attempts to reconnect previous talking points, but the Pt. settles back into a peaceful place of rest.    The chaplain will F/U with intercessory prayer.

## 2020-08-27 NOTE — Progress Notes (Signed)
Pt observed not have passed urine during the shift, pt encouraged to use the [facility] but refused, bladder scanned to a volume of at 0510, drained out eventually at 0515, pt cleaned up and repositioned in bed, will continue to monitor. Obasogie-Asidi, Braniya Farrugia Efe

## 2020-08-27 NOTE — Progress Notes (Signed)
Transitions of Care Pharmacist Note  Jodi Barr is a 87 y.o. female that has been diagnosed with A Fib and will be prescribed Eliquis (apixaban) at discharge.   Patient Education: I provided the following education on Eliquis to the patient: How to take the medication Described what the medication is Signs of bleeding Answered their questions  Discharge Medications Plan: The patient is discharging to SNF and will not be filling discharge medications with the [facility] pharmacy.  If discharging home and the patient later decides they would like to have the Transitions of Care pharmacy fill their discharge medications, please call us at [phone]. Thank you.   Thank you,  Laverna Peace, PharmD PGY-1 Pharmacy Resident 08/27/2020 3:22 PM Please see AMION for all pharmacy numbers

## 2020-08-27 NOTE — Progress Notes (Signed)
[facility] PROGRESS NOTE    Progress Note  Jodi Barr  WSF:[id number] DOB: 22-Dec-1932 DOA: 08/17/2020 PCP: Geoffry Paradise, MD     Brief Narrative:   Jodi Barr is an 87 y.o. female past medical history of essential hypertension hyperlipidemia chronic T12 compression, constipation comes into the hospital she was found to be unresponsive.  Patient was having back pain and started on muscle relaxant as an outpatient.  In the ED was found to have left-sided weakness and weakness effusion started on Keppra EEG was done that showed encephalopathy CT spine normal, CT angio of the head and neck no acute findings, CT of the head showed no acute intracranial abnormalities.  She is awaiting placement to skilled nursing facility.  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   Blood pressure continues to improve and reach as high as 160. Continue Norvasc hydralazine Avapro and Coreg. Hydralazine as needed. Physical therapy evaluated the patient recommended skilled nursing facility. He is now DNR/DNI she would like antibiotics fluids but no feeding tube.  New onset atrial fibrillation: Started coreg, heart rate< 100 Cont Eliquis, no history of falls or bleeds. CHA2DS2-VASc Score = 6   Hypovolemic hyponatremia: Now resolved KVO IV fluids.  Leukocytosis: With a T-max of 98.3 no clear source of infection question stress margination in the setting of seizures for blood cell trending down off antibiotics culture data has remained negative.  Hyperlipidemia: Continue statins.  Hypomagnesemia: Repleted try to keep greater than 2.  Lactic acidosis:  in the setting of seizures and dehydration now resolved.  History of T12 compression fracture: Increased [place] continue tramadol she relates her pain is not controlled.     DVT prophylaxis: lovenox Family Communication:none Status is:  Inpatient  Remains inpatient appropriate because:Hemodynamically unstable  Dispo: The patient is from: Home              Anticipated d/c is to: SNF              Patient currently is not medically stable to d/c.   Difficult to place patient No      Code Status:     Code Status Orders  (From admission, onward)           Start     Ordered   08/17/20 1338  Full code  Continuous        08/17/20 1342           Code Status History     This patient has a current code status but no historical code status.         IV Access:   Peripheral IV   Procedures and diagnostic studies:   No results found.   Medical Consultants:   None.   Subjective:    Jodi Barr relates her pain is not controlled not eating due to pain.  Objective:    Vitals:   08/26/20 1941 08/26/20 2339 08/27/20 0327 08/27/20 0800  BP: 127/81 121/71 (!) 153/58 (!) 164/71  Pulse: 85 69 (!) 59 74  Resp: 17 17 18 17   Temp: 97.9 F (36.6 C) 97.7 F (36.5 C) 97.8 F (36.6 C) 98 F (36.7 C)  TempSrc: Oral Oral Oral Oral  SpO2: 98% 98% 98%   Weight:       SpO2: 98 % O2 Flow Rate (L/min): 1.5 L/min   Intake/Output Summary (Last 24 hours) at 08/27/2020 08/29/2020 Last data filed at 08/27/2020  0515 Gross per 24 hour  Intake --  Output 950 ml  Net -950 ml    Filed Weights   08/17/20 0900  Weight: 53 kg    Exam: General exam: In no acute distress. Respiratory system: Good air movement and clear to auscultation. Cardiovascular system: S1 & S2 heard, RRR. No JVD. Gastrointestinal system: Abdomen is nondistended, soft and nontender.  Extremities: No pedal edema. Skin: No rashes, lesions or ulcers  Data Reviewed:    Labs: Basic Metabolic Panel: Recent Labs  Lab 08/24/20 0503  CREATININE 0.79    GFR CrCl cannot be calculated (Unknown ideal weight.). Liver Function Tests: No results for input(s): AST, ALT, ALKPHOS, BILITOT, PROT, ALBUMIN in the last 168 hours.  No  results for input(s): LIPASE, AMYLASE in the last 168 hours. No results for input(s): AMMONIA in the last 168 hours. Coagulation profile No results for input(s): INR, PROTIME in the last 168 hours.  COVID-19 Labs  No results for input(s): DDIMER, FERRITIN, LDH, CRP in the last 72 hours.  Lab Results  Component Value Date   SARSCOV2NAA NEGATIVE 08/23/2020   SARSCOV2NAA NEGATIVE 08/17/2020    CBC: No results for input(s): WBC, NEUTROABS, HGB, HCT, MCV, PLT in the last 168 hours.  Cardiac Enzymes: No results for input(s): CKTOTAL, CKMB, CKMBINDEX, TROPONINI in the last 168 hours.  BNP (last 3 results) No results for input(s): PROBNP in the last 8760 hours. CBG: No results for input(s): GLUCAP in the last 168 hours.  D-Dimer: No results for input(s): DDIMER in the last 72 hours. Hgb A1c: No results for input(s): HGBA1C in the last 72 hours. Lipid Profile: No results for input(s): CHOL, HDL, LDLCALC, TRIG, CHOLHDL, LDLDIRECT in the last 72 hours.  Thyroid function studies: No results for input(s): TSH, T4TOTAL, T3FREE, THYROIDAB in the last 72 hours.  Invalid input(s): FREET3  Anemia work up: No results for input(s): VITAMINB12, FOLATE, FERRITIN, TIBC, IRON, RETICCTPCT in the last 72 hours.  Sepsis Labs: No results for input(s): PROCALCITON, WBC, LATICACIDVEN in the last 168 hours.  Microbiology Recent Results (from the past 240 hour(s))  Resp Panel by RT-PCR (Flu A&B, Covid) Nasopharyngeal Swab     Status: None   Collection Time: 08/17/20 10:22 AM   Specimen: Nasopharyngeal Swab; Nasopharyngeal(NP) swabs in vial transport medium  Result Value Ref Range Status   SARS Coronavirus 2 by RT PCR NEGATIVE NEGATIVE Final    Comment: (NOTE) SARS-CoV-2 target nucleic acids are NOT DETECTED.  The SARS-CoV-2 RNA is generally detectable in upper respiratory specimens during the acute phase of infection. The lowest concentration of SARS-CoV-2 viral copies this assay can detect  is 138 copies/mL. A negative result does not preclude SARS-Cov-2 infection and should not be used as the sole basis for treatment or other patient management decisions. A negative result may occur with  improper specimen collection/handling, submission of specimen other than nasopharyngeal swab, presence of viral mutation(s) within the areas targeted by this assay, and inadequate number of viral copies(<138 copies/mL). A negative result must be combined with clinical observations, patient history, and epidemiological information. The expected result is Negative.  Fact Sheet for Patients:  BloggerCourse.com  Fact Sheet for Healthcare Providers:  SeriousBroker.it  This test is no t yet approved or cleared by the [place] FDA and  has been authorized for detection and/or diagnosis of SARS-CoV-2 by FDA under an Emergency Use Authorization (EUA). This EUA will remain  in effect (meaning this test can be used) for the duration of the  COVID-19 declaration under Section 564(b)(1) of the Act, 21 U.S.C.section 360bbb-3(b)(1), unless the authorization is terminated  or revoked sooner.       Influenza A by PCR NEGATIVE NEGATIVE Final   Influenza B by PCR NEGATIVE NEGATIVE Final    Comment: (NOTE) The Xpert Xpress SARS-CoV-2/FLU/RSV plus assay is intended as an aid in the diagnosis of influenza from Nasopharyngeal swab specimens and should not be used as a sole basis for treatment. Nasal washings and aspirates are unacceptable for Xpert Xpress SARS-CoV-2/FLU/RSV testing.  Fact Sheet for Patients: BloggerCourse.comhttps://www.fda.gov/media/152166/download  Fact Sheet for Healthcare Providers: SeriousBroker.ithttps://www.fda.gov/media/152162/download  This test is not yet approved or cleared by the [place] FDA and has been authorized for detection and/or diagnosis of SARS-CoV-2 by FDA under an Emergency Use Authorization (EUA). This EUA will remain in effect  (meaning this test can be used) for the duration of the COVID-19 declaration under Section 564(b)(1) of the Act, 21 U.S.C. section 360bbb-3(b)(1), unless the authorization is terminated or revoked.  Performed at [facility], [address] [place], [place] [address]   Culture, blood (routine x 2)     Status: None   Collection Time: 08/17/20  1:20 PM   Specimen: BLOOD  Result Value Ref Range Status   Specimen Description BLOOD RIGHT ANTECUBITAL  Final   Special Requests   Final    BOTTLES DRAWN AEROBIC AND ANAEROBIC Blood Culture results may not be optimal due to an inadequate volume of blood received in culture bottles   Culture   Final    NO GROWTH 5 DAYS Performed at [facility], [address] [place], [place] [address]    Report Status 08/22/2020 FINAL  Final  Culture, blood (routine x 2)     Status: None   Collection Time: 08/17/20  2:50 PM   Specimen: BLOOD  Result Value Ref Range Status   Specimen Description BLOOD RIGHT ANTECUBITAL  Final   Special Requests   Final    BOTTLES DRAWN AEROBIC AND ANAEROBIC Blood Culture results may not be optimal due to an inadequate volume of blood received in culture bottles   Culture   Final    NO GROWTH 5 DAYS Performed at [facility], [address] [place], [place] [address]    Report Status 08/22/2020 FINAL  Final  SARS CORONAVIRUS 2 (TAT 6-24 HRS) Nasopharyngeal Nasopharyngeal Swab     Status: None   Collection Time: 08/23/20 12:58 PM   Specimen: Nasopharyngeal Swab  Result Value Ref Range Status   SARS Coronavirus 2 NEGATIVE NEGATIVE Final    Comment: (NOTE) SARS-CoV-2 target nucleic acids are NOT DETECTED.  The SARS-CoV-2 RNA is generally detectable in upper and lower respiratory specimens during the acute phase of infection. Negative results do not preclude SARS-CoV-2 infection, do not rule out co-infections with other pathogens, and should not be used as the sole basis for treatment or other patient  management decisions. Negative results must be combined with clinical observations, patient history, and epidemiological information. The expected result is Negative.  Fact Sheet for Patients: HairSlick.nohttps://www.fda.gov/media/138098/download  Fact Sheet for Healthcare Providers: quierodirigir.comhttps://www.fda.gov/media/138095/download  This test is not yet approved or cleared by the [place] FDA and  has been authorized for detection and/or diagnosis of SARS-CoV-2 by FDA under an Emergency Use Authorization (EUA). This EUA will remain  in effect (meaning this test can be used) for the duration of the COVID-19 declaration under Se ction 564(b)(1) of the Act, 21 U.S.C. section 360bbb-3(b)(1),  unless the authorization is terminated or revoked sooner.  Performed at [facility], [address] [place], [place] [address]      Medications:    amLODipine  10 mg Oral Daily   apixaban  2.5 mg Oral BID   atorvastatin  40 mg Oral Daily   carvedilol  25 mg Oral BID WC   Chlorhexidine Gluconate Cloth  6 each Topical Daily   DULoxetine  30 mg Oral Daily   hydrochlorothiazide  25 mg Oral Daily   irbesartan  300 mg Oral Daily   lidocaine  1 patch Transdermal Q24H   Continuous Infusions:  sodium chloride 10 mL/hr at 08/24/20 0028      LOS: 10 days   Marinda Elk  [facility]  08/27/2020, 9:16 AM

## 2020-08-27 NOTE — Progress Notes (Signed)
Physical Therapy Treatment Patient Details Name: Jodi Barr MRN: [id number] DOB: September 07, 1932 Today's Date: 08/27/2020    History of Present Illness Pt is an 87 y.o. female after being found responsive and having a witnessed (by EMS) seizure with left sided weakness and AMS. CT and MRI negative for acute changes.  CXR 6/9: Small left effusion with underlying opacity, likely atelectasis. PMH: HTN, HLD, GERD, chronic T12 compression fracture, constipation.    PT Comments    Today's skilled session focused on strengthening as pt refused EOB/OOB due to back pain and not feeling well today. Acute PT to continue during pt's hospital stay.  Follow Up Recommendations  SNF     Equipment Recommendations  Wheelchair (measurements PT)    Recommendations for Other Services       Precautions / Restrictions Precautions Precautions: Fall    Mobility  Bed Mobility Overal bed mobility: Needs Assistance             General bed mobility comments: max assit with pads to scoot to [facility]    Transfers                 General transfer comment: pt deferred       Cognition Arousal/Alertness: Awake/alert;Lethargic (talkative with low voice with eyes closed most of session) Behavior During Therapy: WFL for tasks assessed/performed Overall Cognitive Status: Impaired/Different from baseline Area of Impairment: Problem solving                             Problem Solving: Slow processing;Requires verbal cues;Requires tactile cues General Comments: difficult to assess due to lethargy, followed simple, one step commands      Exercises General Exercises - Lower Extremity Ankle Circles/Pumps: AROM;Strengthening;Both;10 reps;Supine;Limitations Ankle Circles/Pumps Limitations: manual resistance Quad Sets: AROM;Strengthening;Both;10 reps;Supine;Limitations Quad Sets Limitations: 5 sec holds Heel Slides: AROM;Strengthening;Both;10 reps;Supine;Limitations Heel Slides  Limitations: manual resistance with extension Straight Leg Raises: AAROM;Strengthening;Both;10 reps;Supine;Limitations Straight Leg Raises Limitations: limited range due to back pain     Pertinent Vitals/Pain Faces Pain Scale: Hurts little more Pain Location: back Pain Descriptors / Indicators: Aching Pain Intervention(s): Limited activity within patient's tolerance;Monitored during session;Premedicated before session     PT Goals (current goals can now be found in the care plan section) Acute Rehab PT Goals Patient Stated Goal: walk PT Goal Formulation: With patient Time For Goal Achievement: 09/04/20 Potential to Achieve Goals: Fair Progress towards PT goals: Progressing toward goals    Frequency    Min 2X/week      PT Plan Current plan remains appropriate    AM-PAC PT "6 Clicks" Mobility   Outcome Measure  Help needed turning from your back to your side while in a flat bed without using bedrails?: A Lot Help needed moving from lying on your back to sitting on the side of a flat bed without using bedrails?: A Lot Help needed moving to and from a bed to a chair (including a wheelchair)?: A Lot Help needed standing up from a chair using your arms (e.g., wheelchair or bedside chair)?: A Lot Help needed to walk in hospital room?: A Lot Help needed climbing 3-5 steps with a railing? : Total 6 Click Score: 11    End of Session Equipment Utilized During Treatment: Oxygen Activity Tolerance: Patient limited by pain;Patient limited by fatigue Patient left: in bed;with call bell/phone within reach;with bed alarm set Nurse Communication: Mobility status PT Visit Diagnosis: Muscle weakness (generalized) (M62.81);Difficulty in walking, not  elsewhere classified (R26.2);Pain Pain - part of body:  (back)     Time: 8841-6606 PT Time Calculation (min) (ACUTE ONLY): 14 min  Charges:  $Therapeutic Exercise: 8-22 mins                    Sallyanne Kuster, PTA, [facility] Acute Rehab  Services Office- [phone] 08/27/20, 11:25 AM   Sallyanne Kuster 08/27/2020, 11:23 AM

## 2020-08-28 DIAGNOSIS — R569 Unspecified convulsions: Secondary | ICD-10-CM | POA: Diagnosis not present

## 2020-08-28 DIAGNOSIS — R296 Repeated falls: Secondary | ICD-10-CM | POA: Diagnosis not present

## 2020-08-28 DIAGNOSIS — M549 Dorsalgia, unspecified: Secondary | ICD-10-CM | POA: Diagnosis not present

## 2020-08-28 DIAGNOSIS — R531 Weakness: Secondary | ICD-10-CM | POA: Diagnosis not present

## 2020-08-28 DIAGNOSIS — R627 Adult failure to thrive: Secondary | ICD-10-CM

## 2020-08-28 DIAGNOSIS — R4182 Altered mental status, unspecified: Secondary | ICD-10-CM | POA: Diagnosis not present

## 2020-08-28 MED ORDER — OXYCODONE HCL 5 MG PO TABS: 10.0000 mg | ORAL_TABLET | ORAL | Status: DC | PRN

## 2020-08-28 MED ORDER — OXYCODONE HCL 5 MG PO TABS
10.0000 mg | ORAL_TABLET | ORAL | Status: DC | PRN
  Administered 2020-08-29: 10 mg via ORAL
  Filled 2020-08-28: qty 2

## 2020-08-28 MED ORDER — OXYCODONE HCL 5 MG PO TABS
15.0000 mg | ORAL_TABLET | ORAL | Status: DC | PRN
  Administered 2020-08-28: 15 mg via ORAL
  Filled 2020-08-28: qty 3

## 2020-08-28 MED ORDER — POLYETHYLENE GLYCOL 3350 17 G PO PACK
17.0000 g | PACK | Freq: Two times a day (BID) | ORAL | Status: DC
  Administered 2020-08-28 – 2020-08-29 (×3): 17 g via ORAL
  Filled 2020-08-28 (×3): qty 1

## 2020-08-28 NOTE — TOC Progression Note (Addendum)
Transition of Care [facility]) - Progression Note    Patient Details  Name: Jodi Barr MRN: [id number] Date of Birth: 1932/09/14  Transition of Care [facility]) CM/SW Contact  Baldemar Len[place], [place] Phone Number: 08/28/2020, 4:16 PM  Clinical Narrative:   CSW following for SNF placement. Awaiting patient's HCPOA to make a SNF choice. CSW attempted to reach [facility] today, no answer and left a voicemail. CSW awaiting call back from [facility] with SNF choice.   UPDATE: CSW received call back from [facility], that they are still looking into SNF options and have not made a decision yet. CSW discussed with [name] that if patient is discharged tomorrow then he will have to make a decision, and [name] indicated understanding. CSW to continue to follow.    Expected Discharge Plan: Skilled Nursing Facility Barriers to Discharge: Continued Medical Work up, English as a second language teacher  Expected Discharge Plan and Services Expected Discharge Plan: Skilled Nursing Facility     Post Acute Care Choice: Skilled Nursing Facility Living arrangements for the past 2 months: Single Family Home                                       Social Determinants of Health (SDOH) Interventions    Readmission Risk Interventions No flowsheet data found.

## 2020-08-28 NOTE — Progress Notes (Signed)
[facility] PROGRESS NOTE    Progress Note  Jodi Barr  EYC:[id number] DOB: October 02, 1932 DOA: 08/17/2020 PCP: Jodi Paradise, MD     Brief Narrative:   Jodi Barr is an 87 y.o. female past medical history of essential hypertension hyperlipidemia chronic T12 compression, constipation comes into the hospital she was found to be unresponsive.  Patient was having back pain and started on muscle relaxant as an outpatient.  In the ED was found to have left-sided weakness and weakness effusion started on Keppra EEG was done that showed encephalopathy CT spine normal, CT angio of the head and neck no acute findings, CT of the head showed no acute intracranial abnormalities.  She is awaiting placement to skilled nursing facility.  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   Blood pressure is this morning was 97/48.  She is becoming hypotensive her trend has been going down. Discontinue Norvasc. Continue hydralazine Avapro Coreg and hydrochlorothiazide. Check orthostatics. Hospice a0nd palliative Care was consulted she was made a DNR/DNI, hospice and palliative care to follow-up as an outpatient. Continue Norvasc hydralazine Avapro and Coreg. Physical therapy evaluated the patient recommended skilled nursing facility.  Hopefully skilled nursing facility in the morning.  New onset atrial fibrillation: Started coreg, heart rate< 100 Cont Eliquis, no history of falls or bleeds. CHA2DS2-VASc Score = 6   Hypovolemic hyponatremia: Now resolved KVO IV fluids.  Leukocytosis: With a T-max of 98.3 no clear source of infection question stress margination in the setting of seizures for blood cell trending down off antibiotics culture data has remained negative.  Hyperlipidemia: Continue statins.  Hypomagnesemia: Repleted try to keep greater than 2.  Lactic acidosis:  in the setting of  seizures and dehydration now resolved.  History of T12 compression fracture: Relates her pain is still not controlled increase her OxyContin and tramadol. Has not had a bowel movement started on MiraLAX p.o. twice daily.     DVT prophylaxis: lovenox Family Communication:none Status is: Inpatient  Remains inpatient appropriate because:Hemodynamically unstable  Dispo: The patient is from: Home              Anticipated d/c is to: SNF              Patient currently is not medically stable to d/c.   Difficult to place patient No      Code Status:     Code Status Orders  (From admission, onward)           Start     Ordered   08/17/20 1338  Full code  Continuous        08/17/20 1342           Code Status History     This patient has a current code status but no historical code status.         IV Access:   Peripheral IV   Procedures and diagnostic studies:   No results found.   Medical Consultants:   None.   Subjective:    Jodi Barr has no appetite she relates she still in pain she relates to narcotics are not working.  Objective:    Vitals:   08/27/20 1716 08/27/20 1954 08/27/20 2348 08/28/20 0340  BP: 122/60 (!) 127/56 (!) 111/53 (!) 97/48  Pulse: 70 72 63 61  Resp: 17 16 18 16   Temp: 98.3 F (36.8 C) 98 F (36.7 C) 98.4 F (36.9 C) 97.9  F (36.6 C)  TempSrc: Oral Oral Oral Oral  SpO2: 90% 91% 99% 98%  Weight:       SpO2: 98 % O2 Flow Rate (L/min): 2 L/min   Intake/Output Summary (Last 24 hours) at 08/28/2020 0819 Last data filed at 08/27/2020 1955 Gross per 24 hour  Intake --  Output 1200 ml  Net -1200 ml    Filed Weights   08/17/20 0900  Weight: 53 kg    Exam: General exam: In no acute distress. Respiratory system: Good air movement and clear to auscultation. Cardiovascular system: S1 & S2 heard, RRR. No JVD. Gastrointestinal system: Abdomen is nondistended, soft and nontender.  Extremities: No pedal  edema. Skin: No rashes, lesions or ulcers  Data Reviewed:    Labs: Basic Metabolic Panel: Recent Labs  Lab 08/24/20 0503  CREATININE 0.79    GFR CrCl cannot be calculated (Unknown ideal weight.). Liver Function Tests: No results for input(s): AST, ALT, ALKPHOS, BILITOT, PROT, ALBUMIN in the last 168 hours.  No results for input(s): LIPASE, AMYLASE in the last 168 hours. No results for input(s): AMMONIA in the last 168 hours. Coagulation profile No results for input(s): INR, PROTIME in the last 168 hours.  COVID-19 Labs  No results for input(s): DDIMER, FERRITIN, LDH, CRP in the last 72 hours.  Lab Results  Component Value Date   SARSCOV2NAA NEGATIVE 08/23/2020   SARSCOV2NAA NEGATIVE 08/17/2020    CBC: No results for input(s): WBC, NEUTROABS, HGB, HCT, MCV, PLT in the last 168 hours.  Cardiac Enzymes: No results for input(s): CKTOTAL, CKMB, CKMBINDEX, TROPONINI in the last 168 hours.  BNP (last 3 results) No results for input(s): PROBNP in the last 8760 hours. CBG: No results for input(s): GLUCAP in the last 168 hours.  D-Dimer: No results for input(s): DDIMER in the last 72 hours. Hgb A1c: No results for input(s): HGBA1C in the last 72 hours. Lipid Profile: No results for input(s): CHOL, HDL, LDLCALC, TRIG, CHOLHDL, LDLDIRECT in the last 72 hours.  Thyroid function studies: No results for input(s): TSH, T4TOTAL, T3FREE, THYROIDAB in the last 72 hours.  Invalid input(s): FREET3  Anemia work up: No results for input(s): VITAMINB12, FOLATE, FERRITIN, TIBC, IRON, RETICCTPCT in the last 72 hours.  Sepsis Labs: No results for input(s): PROCALCITON, WBC, LATICACIDVEN in the last 168 hours.  Microbiology Recent Results (from the past 240 hour(s))  SARS CORONAVIRUS 2 (TAT 6-24 HRS) Nasopharyngeal Nasopharyngeal Swab     Status: None   Collection Time: 08/23/20 12:58 PM   Specimen: Nasopharyngeal Swab  Result Value Ref Range Status   SARS Coronavirus 2  NEGATIVE NEGATIVE Final    Comment: (NOTE) SARS-CoV-2 target nucleic acids are NOT DETECTED.  The SARS-CoV-2 RNA is generally detectable in upper and lower respiratory specimens during the acute phase of infection. Negative results do not preclude SARS-CoV-2 infection, do not rule out co-infections with other pathogens, and should not be used as the sole basis for treatment or other patient management decisions. Negative results must be combined with clinical observations, patient history, and epidemiological information. The expected result is Negative.  Fact Sheet for Patients: HairSlick.no  Fact Sheet for Healthcare Providers: quierodirigir.com  This test is not yet approved or cleared by the [place] FDA and  has been authorized for detection and/or diagnosis of SARS-CoV-2 by FDA under an Emergency Use Authorization (EUA). This EUA will remain  in effect (meaning this test can be used) for the duration of the COVID-19 declaration under Se ction 564(b)(1)  of the Act, 21 U.S.C. section 360bbb-3(b)(1), unless the authorization is terminated or revoked sooner.  Performed at [facility], [address], [place], [place] [address]      Medications:    amLODipine  10 mg Oral Daily   apixaban  2.5 mg Oral BID   atorvastatin  40 mg Oral Daily   carvedilol  25 mg Oral BID WC   Chlorhexidine Gluconate Cloth  6 each Topical Daily   DULoxetine  30 mg Oral Daily   hydrochlorothiazide  25 mg Oral Daily   irbesartan  300 mg Oral Daily   lidocaine  1 patch Transdermal Q24H   polyethylene glycol  17 g Oral BID   Continuous Infusions:  sodium chloride 10 mL/hr at 08/24/20 0028      LOS: 11 days   Marinda Elk  [facility]  08/28/2020, 8:19 AM

## 2020-08-28 NOTE — Plan of Care (Signed)
  Problem: Education: Goal: Knowledge of General Education information will improve Description: Including pain rating scale, medication(s)/side effects and non-pharmacologic comfort measures Outcome: Progressing   Problem: Health Behavior/Discharge Planning: Goal: Ability to manage health-related needs will improve Outcome: Progressing   Problem: Clinical Measurements: Goal: Ability to maintain clinical measurements within normal limits will improve Outcome: Progressing Goal: Will remain free from infection Outcome: Progressing Goal: Diagnostic test results will improve Outcome: Progressing Goal: Respiratory complications will improve Outcome: Progressing Goal: Cardiovascular complication will be avoided Outcome: Progressing   Problem: Activity: Goal: Risk for activity intolerance will decrease Outcome: Progressing   Problem: Nutrition: Goal: Adequate nutrition will be maintained Outcome: Progressing   Problem: Coping: Goal: Level of anxiety will decrease Outcome: Progressing   Problem: Elimination: Goal: Will not experience complications related to bowel motility Outcome: Progressing Goal: Will not experience complications related to urinary retention Outcome: Progressing   Problem: Pain Managment: Goal: General experience of comfort will improve Outcome: Progressing   Problem: Safety: Goal: Ability to remain free from injury will improve Outcome: Progressing   Problem: Skin Integrity: Goal: Risk for impaired skin integrity will decrease Outcome: Progressing   Problem: Education: Goal: Expressions of having a comfortable level of knowledge regarding the disease process will increase Outcome: Progressing   Problem: Coping: Goal: Ability to adjust to condition or change in health will improve Outcome: Progressing Goal: Ability to identify appropriate support needs will improve Outcome: Progressing   Problem: Health Behavior/Discharge Planning: Goal:  Compliance with prescribed medication regimen will improve Outcome: Progressing   Problem: Medication: Goal: Risk for medication side effects will decrease Outcome: Progressing   Problem: Clinical Measurements: Goal: Complications related to the disease process, condition or treatment will be avoided or minimized Outcome: Progressing Goal: Diagnostic test results will improve Outcome: Progressing   Problem: Safety: Goal: Verbalization of understanding the information provided will improve Outcome: Progressing   Problem: Self-Concept: Goal: Level of anxiety will decrease Outcome: Progressing Goal: Ability to verbalize feelings about condition will improve Outcome: Progressing   Problem: Education: Goal: Knowledge of disease or condition will improve Outcome: Progressing Goal: Knowledge of secondary prevention will improve Outcome: Progressing Goal: Knowledge of patient specific risk factors addressed and post discharge goals established will improve Outcome: Progressing Goal: Individualized Educational Video(s) Outcome: Progressing   Problem: Coping: Goal: Will verbalize positive feelings about self Outcome: Progressing Goal: Will identify appropriate support needs Outcome: Progressing   Problem: Health Behavior/Discharge Planning: Goal: Ability to manage health-related needs will improve Outcome: Progressing   Problem: Self-Care: Goal: Ability to participate in self-care as condition permits will improve Outcome: Progressing Goal: Verbalization of feelings and concerns over difficulty with self-care will improve Outcome: Progressing Goal: Ability to communicate needs accurately will improve Outcome: Progressing   Problem: Nutrition: Goal: Risk of aspiration will decrease Outcome: Progressing Goal: Dietary intake will improve Outcome: Progressing   Problem: Ischemic Stroke/TIA Tissue Perfusion: Goal: Complications of ischemic stroke/TIA will be  minimized Outcome: Progressing   

## 2020-08-28 NOTE — Progress Notes (Addendum)
Daily Progress Note   Patient Name: Jodi Barr       Date: 08/28/2020 DOB: 05-01-1932  Age: 87 y.o. MRN#: [id number] Attending Physician: Charlynne Cousins, MD Primary Care Physician: Burnard Bunting, MD Admit Date: 08/17/2020  Reason for Consultation/Follow-up: goals of care, symptom management  Subjective: Note that patient declined working with PT yesterday due to "back pain and not feeling well".   Patient is again resting with her eyes closed when I enter the room but awakens easily to voice. She reports having "some pain but not a lot". She appears comfortable - no nonverbal signs of discomfort noted. She is unable to stay engaged in conversation - quickly drifts back to sleep.   Spoke with [employer] briefly by phone. I offered and explained the option for an outpatient palliative referral to check-in periodically with patient and continue goals of care discussions with he and his wife Ria Comment as patient's HCPOAs. Discussed that outpatient palliative can also help with transition to comfort care when her condition declines in the future. Tyrone Schimke is agreeable to outpatient palliative referral.    Length of Stay: 11  Current Medications: Scheduled Meds:  . apixaban  2.5 mg Oral BID  . atorvastatin  40 mg Oral Daily  . carvedilol  25 mg Oral BID WC  . Chlorhexidine Gluconate Cloth  6 each Topical Daily  . DULoxetine  30 mg Oral Daily  . hydrochlorothiazide  25 mg Oral Daily  . irbesartan  300 mg Oral Daily  . lidocaine  1 patch Transdermal Q24H  . polyethylene glycol  17 g Oral BID    Continuous Infusions: . sodium chloride 10 mL/hr at 08/24/20 0028    PRN Meds: haloperidol lactate, hydrALAZINE, morphine injection, Muscle Rub, ondansetron, [START ON 08/29/2020]  oxyCODONE, QUEtiapine  Physical Exam Constitutional:      General: She is not in acute distress.    Appearance: She is ill-appearing.     Comments: frail  Pulmonary:     Effort: Pulmonary effort is normal.  Neurological:     Mental Status: She is alert and oriented to person, place, and time.     Motor: Weakness present.            Vital Signs: BP (!) 143/70 (BP Location: Left Arm)   Pulse 62   Temp  97.9 F (36.6 C) (Oral)   Resp 16   Wt 53 kg   SpO2 98%   BMI 20.70 kg/m  SpO2: SpO2: 98 % O2 Device: O2 Device: Nasal Cannula O2 Flow Rate: O2 Flow Rate (L/min): 2 L/min  Intake/output summary:  Intake/Output Summary (Last 24 hours) at 08/28/2020 1844 Last data filed at 08/28/2020 1839 Gross per 24 hour  Intake --  Output 2050 ml  Net -2050 ml   LBM: Last BM Date: 08/26/20 Baseline Weight: Weight: 53 kg Most recent weight: Weight: 53 kg       Palliative Assessment/Data: PPS 30%      Palliative Care Assessment & Plan    HPI/Patient Profile: 87 y.o. female  with past medical history of osteoarthritis, hypertension, hyperlipidemia, GERD, and chronic T12 compression fracture. She presented to the emergency department on 08/17/20 with altered mental status. She had reportedly been having back pain and was prescribed a muscle relaxer. On the morning of 08/17/20, she called her neighbor and asked him to come over because she was in pain. When he went to her house he found she had fallen down by her bed so he called EMS. When EMS arrived, patient was not confused but then had a witnessed seizure and left sided weakness in route. On arrival to the ED, patient was altered and non-verbal. Code stroke was called and CT head was negative. CT spine negative. CTA head and neck was negative for large vessel occlusion. Bedside EEG showed moderate diffuse encephalopathy nonspecific etiology but no seizures or epileptiform discharges seen.    Assessment: - acute metabolic encephalopathy,  weakness, seizure (probably due to PRES) - hypovolemic hyponatremia - leukocytosis - lactic acidosis (resolved) - acute on chronic back pain, history of T12 compression fracture   Recommendations/Plan: Continue duloxetine (Cymbalta) 30 mg daily Continue Oxycodone IR 10 mg every 3 hours as needed outpatient palliative at discharge - TOC order placed This NP is off service until Saturday 6/25 - please call [phone] for urgent needs   Goals of Care and Additional Recommendations (from [facility] completed 08/24/20):   Cardiopulmonary Resuscitation: Do Not Attempt Resuscitation (DNR/No CPR)  Medical Interventions: Comfort Measures: Keep clean, warm, and dry. Use medication by any route, positioning, wound care, and other measures to relieve pain and suffering. Use oxygen, suction and manual treatment of airway obstruction as needed for comfort. Do not transfer to the hospital unless comfort needs cannot be met in current location.  Antibiotics: Antibiotics if indicated  IV Fluids: IV fluids for a defined trial period  Feeding Tube: No feeding tube    Code Status: DNR/DNI   Prognosis:  Unable to determine  Discharge Planning: [facility] for rehab with Palliative care service follow-up    Thank you for allowing the Palliative Medicine Team to assist in the care of this patient.   Total Time 25 minutes Prolonged Time Billed  no       Greater than 50%  of this time was spent counseling and coordinating care related to the above assessment and plan.  Lavena Bullion, NP  Please contact Palliative Medicine Team phone at [phone] for questions and concerns.

## 2020-08-29 ENCOUNTER — Other Ambulatory Visit: Payer: Self-pay | Admitting: Nurse Practitioner

## 2020-08-29 DIAGNOSIS — R569 Unspecified convulsions: Secondary | ICD-10-CM | POA: Diagnosis not present

## 2020-08-29 DIAGNOSIS — R4182 Altered mental status, unspecified: Secondary | ICD-10-CM | POA: Diagnosis not present

## 2020-08-29 DIAGNOSIS — J9601 Acute respiratory failure with hypoxia: Secondary | ICD-10-CM | POA: Diagnosis not present

## 2020-08-29 DIAGNOSIS — M1990 Unspecified osteoarthritis, unspecified site: Secondary | ICD-10-CM | POA: Diagnosis not present

## 2020-08-29 DIAGNOSIS — R531 Weakness: Secondary | ICD-10-CM | POA: Diagnosis not present

## 2020-08-29 DIAGNOSIS — E871 Hypo-osmolality and hyponatremia: Secondary | ICD-10-CM | POA: Diagnosis not present

## 2020-08-29 DIAGNOSIS — R2681 Unsteadiness on feet: Secondary | ICD-10-CM | POA: Diagnosis not present

## 2020-08-29 DIAGNOSIS — R262 Difficulty in walking, not elsewhere classified: Secondary | ICD-10-CM | POA: Diagnosis not present

## 2020-08-29 DIAGNOSIS — M545 Low back pain, unspecified: Secondary | ICD-10-CM | POA: Diagnosis not present

## 2020-08-29 DIAGNOSIS — Z743 Need for continuous supervision: Secondary | ICD-10-CM | POA: Diagnosis not present

## 2020-08-29 DIAGNOSIS — I10 Essential (primary) hypertension: Secondary | ICD-10-CM | POA: Diagnosis not present

## 2020-08-29 DIAGNOSIS — F32A Depression, unspecified: Secondary | ICD-10-CM | POA: Diagnosis not present

## 2020-08-29 DIAGNOSIS — I4891 Unspecified atrial fibrillation: Secondary | ICD-10-CM | POA: Diagnosis not present

## 2020-08-29 DIAGNOSIS — J811 Chronic pulmonary edema: Secondary | ICD-10-CM | POA: Diagnosis not present

## 2020-08-29 DIAGNOSIS — E785 Hyperlipidemia, unspecified: Secondary | ICD-10-CM | POA: Diagnosis not present

## 2020-08-29 DIAGNOSIS — S22080D Wedge compression fracture of T11-T12 vertebra, subsequent encounter for fracture with routine healing: Secondary | ICD-10-CM | POA: Diagnosis not present

## 2020-08-29 DIAGNOSIS — I959 Hypotension, unspecified: Secondary | ICD-10-CM | POA: Diagnosis not present

## 2020-08-29 DIAGNOSIS — Z515 Encounter for palliative care: Secondary | ICD-10-CM | POA: Diagnosis not present

## 2020-08-29 DIAGNOSIS — G9341 Metabolic encephalopathy: Secondary | ICD-10-CM | POA: Diagnosis not present

## 2020-08-29 DIAGNOSIS — K5900 Constipation, unspecified: Secondary | ICD-10-CM | POA: Diagnosis not present

## 2020-08-29 DIAGNOSIS — M549 Dorsalgia, unspecified: Secondary | ICD-10-CM | POA: Diagnosis not present

## 2020-08-29 DIAGNOSIS — R339 Retention of urine, unspecified: Secondary | ICD-10-CM | POA: Diagnosis not present

## 2020-08-29 LAB — RESP PANEL BY RT-PCR (FLU A&B, COVID) ARPGX2
Influenza A by PCR: NEGATIVE
Influenza B by PCR: NEGATIVE
SARS Coronavirus 2 by RT PCR: NEGATIVE

## 2020-08-29 MED ORDER — CARVEDILOL 25 MG PO TABS: 25.0000 mg | ORAL_TABLET | Freq: Two times a day (BID) | ORAL | Status: AC

## 2020-08-29 MED ORDER — SORBITOL 70 % SOLN
400.0000 mL | TOPICAL_OIL | Freq: Once | ORAL | Status: DC
  Filled 2020-08-29: qty 120

## 2020-08-29 MED ORDER — HYDROCHLOROTHIAZIDE 25 MG PO TABS: 25.0000 mg | ORAL_TABLET | Freq: Every day | ORAL | Status: AC

## 2020-08-29 MED ORDER — OXYCODONE HCL 10 MG PO TABS: 10.0000 mg | ORAL_TABLET | ORAL | 0 refills | Status: AC | PRN

## 2020-08-29 MED ORDER — DULOXETINE HCL 30 MG PO CPEP: 30.0000 mg | ORAL_CAPSULE | Freq: Every day | ORAL | 3 refills | Status: AC

## 2020-08-29 MED ORDER — ATORVASTATIN CALCIUM 40 MG PO TABS: 40.0000 mg | ORAL_TABLET | Freq: Every day | ORAL | Status: AC

## 2020-08-29 MED ORDER — IRBESARTAN 300 MG PO TABS: 300.0000 mg | ORAL_TABLET | Freq: Every day | ORAL | Status: AC

## 2020-08-29 MED ORDER — LIDOCAINE 5 % EX PTCH: 1.0000 | MEDICATED_PATCH | CUTANEOUS | 0 refills | Status: AC

## 2020-08-29 MED ORDER — AMLODIPINE BESYLATE 5 MG PO TABS: 2.5000 mg | ORAL_TABLET | Freq: Every day | ORAL | 11 refills | Status: AC

## 2020-08-29 MED ORDER — APIXABAN 2.5 MG PO TABS: 2.5000 mg | ORAL_TABLET | Freq: Two times a day (BID) | ORAL | 0 refills | Status: AC

## 2020-08-29 NOTE — Discharge Summary (Addendum)
NoPhysician Discharge Summary  Jodi Barr ZOX:[id number] DOB: Aug 13, 1932 DOA: 08/17/2020  PCP: Geoffry Paradise, MD  Admit date: 08/17/2020 Discharge date: 08/29/2020  Admitted From: Home Disposition:  SNF  Recommendations for Outpatient Follow-up:  Follow up with PCP in 1-2 weeks Please obtain BMP/CBC in one week She will need a voiding trial as an outpatient, we can try and give her a voiding trial in 1 week. She will need to follow-up with urology in 1 to 2 weeks.  Home Health:no Equipment/Devices:None  Discharge Condition:Stable CODE STATUS:DNR Diet recommendation: Heart Healthy   Brief/Interim Summary:  87 y.o. female past medical history of essential hypertension hyperlipidemia chronic T12 compression, constipation comes into the hospital she was found to be unresponsive.  Patient was having back pain and started on muscle relaxant as an outpatient.  In the ED was found to have left-sided weakness and weakness effusion started on Keppra EEG was done that showed encephalopathy CT spine normal, CT angio of the head and neck no acute findings, CT of the head showed no acute intracranial abnormalities.  Discharge Diagnoses:  Principal Problem:   Seizure [facility]) Active Problems:   Hyperlipidemia   Essential hypertension   Acute left-sided weakness   Altered mental status   Leukocytosis   Hyponatremia   Enlarged thyroid   Low back pain  Acute metabolic encephalopathy/likely due to seizure in the setting of press syndrome: MRI of the brain showed press. She was started on her oral antihypertensive medication which she was not taking home her blood pressure improve very nicely. There is likely due to noncompliance with her medication. She will continue on Norvasc, Avapro, Coreg, hydrochlorothiazide. Hospice and palliative care was consulted and she was made a DNR/DNI.  New onset atrial fibrillation: She was started on Coreg and her heart rate improved to less than 100,  her chads Vascor is greater than 6, she was started on Eliquis she will continue as an outpatient.  Hypovolemic hyponatremia: Resolved with IV fluid hydration.  Leukocytosis: She remained afebrile so Zosyn flexion likely stress margination due to seizures.  Hyperlipidemia: Continue statins.  Hypomagnesemia: Potassium was repleted.  History of T12 compression fracture: She was given lidocaine patch and OxyContin which seems to be controlling her pain.  Acute urinary retention: Is likely due to pain and a Foley was placed she can be given a voiding trial in 1 week if no improvement will need to follow-up with urology as an outpatient.  Discharge Instructions  Discharge Instructions     Diet - low sodium heart healthy   Complete by: As directed    Increase activity slowly   Complete by: As directed       Allergies as of 08/29/2020       Reactions   Aspirin    REACTION: shortness of breath   Diclofenac Sodium    REACTION: Fluttering heart   Diltiazem Hcl    REACTION: Headache   Omeprazole    Penicillins    REACTION: Headache   Prednisone    REACTION: stomach pain, headache   Tramadol Hcl    REACTION: does not remember        Medication List     STOP taking these medications    ALIGN PO   Biofreeze 4 % Gel Generic drug: Menthol (Topical Analgesic)   celecoxib 200 MG capsule Commonly known as: CELEBREX   cholecalciferol 1000 units tablet Commonly known as: VITAMIN D   fexofenadine 180 MG tablet Commonly known as: ALLEGRA   metaxalone  800 MG tablet Commonly known as: SKELAXIN   metoprolol succinate 25 MG 24 hr tablet Commonly known as: TOPROL-XL   olmesartan 40 MG tablet Commonly known as: BENICAR Replaced by: irbesartan 300 MG tablet   olmesartan-hydrochlorothiazide 40-12.5 MG tablet Commonly known as: BENICAR HCT   predniSONE 20 MG tablet Commonly known as: DELTASONE       TAKE these medications    acetaminophen 500 MG  tablet Commonly known as: TYLENOL Take 500 mg by mouth every 6 (six) hours as needed.   amLODipine 5 MG tablet Commonly known as: NORVASC Take 0.5 tablets (2.5 mg total) by mouth daily.   apixaban 2.5 MG Tabs tablet Commonly known as: ELIQUIS Take 1 tablet (2.5 mg total) by mouth 2 (two) times daily.   atorvastatin 40 MG tablet Commonly known as: LIPITOR Take 1 tablet (40 mg total) by mouth daily.   carvedilol 25 MG tablet Commonly known as: COREG Take 1 tablet (25 mg total) by mouth 2 (two) times daily with a meal.   DULoxetine 30 MG capsule Commonly known as: CYMBALTA Take 1 capsule (30 mg total) by mouth daily.   fluticasone 50 MCG/ACT nasal spray Commonly known as: FLONASE Place into both nostrils daily.   hydrochlorothiazide 25 MG tablet Commonly known as: HYDRODIURIL Take 1 tablet (25 mg total) by mouth daily.   Hyoscyamine Sulfate SL 0.125 MG Subl Commonly known as: Levsin/SL Place 1-2 tablets by mouth under tongue every 4 hours   irbesartan 300 MG tablet Commonly known as: AVAPRO Take 1 tablet (300 mg total) by mouth daily. Replaces: olmesartan 40 MG tablet   lidocaine 5 % Commonly known as: LIDODERM Place 1 patch onto the skin daily. Remove & Discard patch within 12 hours or as directed by MD Start taking on: August 30, 2020   methocarbamol 500 MG tablet Commonly known as: ROBAXIN Take 500 mg by mouth every 8 (eight) hours.   ondansetron 4 MG disintegrating tablet Commonly known as: ZOFRAN-ODT Take 4 mg by mouth every 6 (six) hours as needed.   Oxycodone HCl 10 MG Tabs Take 1 tablet (10 mg total) by mouth every 3 (three) hours as needed for moderate pain or severe pain.   polyethylene glycol powder 17 GM/SCOOP powder Commonly known as: GLYCOLAX/MIRALAX Take 17 g by mouth 2 (two) times daily as needed for mild constipation.        Allergies  Allergen Reactions   Aspirin     REACTION: shortness of breath   Diclofenac Sodium     REACTION:  Fluttering heart   Diltiazem Hcl     REACTION: Headache   Omeprazole    Penicillins     REACTION: Headache   Prednisone     REACTION: stomach pain, headache   Tramadol Hcl     REACTION: does not remember    Consultations: None   Procedures/Studies: DG Chest 1 View  Result Date: 08/20/2020 CLINICAL DATA:  Follow-up abnormal chest x-ray EXAM: CHEST  1 VIEW COMPARISON:  08/17/2020 FINDINGS: Progression of left lower lobe atelectasis. Small left effusion unchanged. Progression of mild right lower lobe airspace disease and small right effusion. Negative for heart failure or edema. IMPRESSION: Progression of bibasilar atelectasis left greater than right. Progression of small right effusion. No change left effusion. Electronically Signed   By: Marlan Palau M.D.   On: 08/20/2020 10:45   DG Lumbar Spine 2-3 Views  Result Date: 08/20/2020 CLINICAL DATA:  Lumbar spine pain EXAM: LUMBAR SPINE - 2-3 VIEW COMPARISON:  CT scan of the abdomen and pelvis 11/25/2019 FINDINGS: Compression fractures present at T11, T12 and L1. The T12 compression fracture appears chronic compared to prior images. The L1 compression fracture is new compared to September of 2021. The remaining lumbar vertebra remain unchanged. There is exaggerated focal kyphosis at the site of 3 contiguous fractures. L5-S1 facet arthropathy. IMPRESSION: 1. Age indeterminate L1 compression fracture is new compared to prior imaging from November 25, 2019. The fracture has occurred at some point in the interim. 2. Stable chronic T12 compression fracture. 3. Age indeterminate T11 compression fracture, favor chronic. 4. Exaggerated focal kyphosis at the site of compression fractures. 5. L5-S1 facet arthropathy. Electronically Signed   By: Malachy Moan M.D.   On: 08/20/2020 10:48   CT HEAD WO CONTRAST  Result Date: 08/18/2020 CLINICAL DATA:  Seizure EXAM: CT HEAD WITHOUT CONTRAST TECHNIQUE: Contiguous axial images were obtained from the base  of the skull through the vertex without intravenous contrast. COMPARISON:  August 17, 2020 FINDINGS: Evaluation is limited secondary to streak artifact from overlying EEG leads. Brain: No evidence of acute infarction, hemorrhage, hydrocephalus, extra-axial collection or mass lesion/mass effect. Periventricular white matter hypodensities consistent with sequela of chronic microvascular ischemic disease. General parenchymal volume loss. Vascular: Vascular calcifications of the carotid siphons. Skull: No acute fracture. Sinuses/Orbits: No acute finding. Other: None. IMPRESSION: No acute intracranial abnormality. Electronically Signed   By: Meda Klinefelter MD   On: 08/18/2020 20:31   MR BRAIN W WO CONTRAST  Result Date: 08/20/2020 CLINICAL DATA:  Seizure.  Significant hypertension on presentation. EXAM: MRI HEAD WITHOUT AND WITH CONTRAST TECHNIQUE: Multiplanar, multiecho pulse sequences of the brain and surrounding structures were obtained without and with intravenous contrast. CONTRAST:  5.6mL GADAVIST GADOBUTROL 1 MMOL/ML IV SOLN COMPARISON:  Head CT 08/18/2020 FINDINGS: Brain: There is no evidence of an acute infarct, intracranial hemorrhage, mass, midline shift, or extra-axial fluid collection. There are patchy T2 hyperintensities in the cerebral white matter bilaterally which are mild-to-moderate for age. Of note, there is relatively symmetric T2 hyperintensity in the subcortical white matter of the occipital, parietal, and posterior frontal lobes. There is mild-to-moderate cerebral atrophy. No abnormal enhancement is identified. Dedicated temporal lobe imaging is mildly motion degraded with symmetric size in grossly symmetric signal of the hippocampi. Vascular: Major intracranial vascular flow voids are preserved. Skull and upper cervical spine: Unremarkable bone marrow signal. Sinuses/Orbits: Unremarkable orbits. Scattered mild mucosal thickening in the paranasal sinuses. No significant mastoid fluid. Other:  None. IMPRESSION: 1. 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 2. Background mild-to-moderate chronic small vessel ischemia in the cerebral white matter. 3. No acute infarct. Electronically Signed   By: Sebastian Ache M.D.   On: 08/20/2020 16:21   CT C-SPINE NO CHARGE  Result Date: 08/17/2020 CLINICAL DATA:  Neck pain EXAM: CT CERVICAL SPINE WITHOUT CONTRAST TECHNIQUE: Multidetector CT imaging of the cervical spine was performed without intravenous contrast. Multiplanar CT image reconstructions were also generated. COMPARISON:  Vascular studies earlier today. FINDINGS: Alignment: Exaggerated cervical lordosis. Skull base and vertebrae: No fracture or primary/focal bone lesion. Soft tissues and spinal canal: Spinal canal is large in widely patent. Disc levels: No significant degenerative disc disease. Very minimal endplate osteophytes at C5-6 but no bony encroachment upon the canal or foramina. Remarkable absence of facet osteoarthritis. Very minimal degenerative change of the left C2-3 facet. All of these findings are much less notable than usually seen at  this age. The canal and foramina are widely patent. Upper chest: Bilateral effusions layering dependently with dependent atelectasis. Other: None IMPRESSION: Remarkably normal appearing cervical spine for a person of this age. Somewhat exaggerated lordosis. No significant degenerative disc disease or degenerative facet disease. No stenosis of the canal or foramina. Electronically Signed   By: Paulina Fusi M.D.   On: 08/17/2020 10:41   DG CHEST PORT 1 VIEW  Result Date: 08/17/2020 CLINICAL DATA:  Seizure. EXAM: PORTABLE CHEST 1 VIEW COMPARISON:  None. FINDINGS: There is a small left effusion with underlying opacity, likely atelectasis. Probable mild cardiomegaly. The hila and mediastinum are unremarkable. No pneumothorax. No nodules or  masses. No overt edema. Mild pulmonary venous congestion not excluded. IMPRESSION: Small left effusion with underlying opacity, likely atelectasis. Mild pulmonary venous congestion is not excluded but there is no overt edema. Electronically Signed   By: Gerome Sam III M.D   On: 08/17/2020 13:51   EEG adult  Result Date: 08/17/2020 Charlsie Quest, MD     08/17/2020 11:58 AM Patient Name: MUNACHIMSO RIGDON MRN: [id number] Epilepsy Attending: Charlsie Quest Referring Physician/Provider: Jimmye Norman, NP Date: 08/17/2020 Duration: 22.51 mins Patient history: 87 year old female with seizure-like activity and left-sided weakness.  EEG generally for seizures. Level of alertness: Awake AEDs during EEG study: Keppra Technical aspects: This EEG study was done with scalp electrodes positioned according to the 10-20 International system of electrode placement. Electrical activity was acquired at a sampling rate of 500Hz  and reviewed with a high frequency filter of 70Hz  and a low frequency filter of 1Hz . EEG data were recorded continuously and digitally stored. Description: EEG showed continuous generalized 5 to 7 Hz theta as well as intermittent generalized 2 to 3 Hz delta slowing. Hyperventilation and photic stimulation were not performed.   ABNORMALITY - Continuous slow, generalized IMPRESSION: This study is suggestive of moderate diffuse encephalopathy, nonspecific etiology. No seizures or epileptiform discharges were seen throughout the recording. Priyanka   Overnight EEG with video  Result Date: 08/18/2020 , MD     08/19/2020  8:52 AM Patient Name: BRIHANA QUICKEL MRN: Charlsie Quest Epilepsy Attending: 10/19/2020 Referring Physician/Provider: [name], NP Duration: 08/17/2020 1134 to 08/18/2020 1134  Patient history: 87 year old female with seizure-like activity and left-sided weakness.  EEG generally for seizures.  Level of alertness: Awake  AEDs during EEG study:  Keppra  Technical aspects: This EEG study was done with scalp electrodes positioned according to the 10-20 International system of electrode placement. Electrical activity was acquired at a sampling rate of 500Hz  and reviewed with a high frequency filter of 70Hz  and a low frequency filter of 1Hz . EEG data were recorded continuously and digitally stored.  Description: EEG showed continuous generalized 5 to 7 Hz theta as well as intermittent generalized 2 to 3 Hz delta slowing. Generalized periodic discharges with triphasic morphology at 1Hz  were also noted intermittently, moe commonly when awake/stimulated. Hyperventilation and photic stimulation were not performed.    ABNORMALITY - Continuous slow, generalized - Periodic discharges with triphasic morphology, generalized ( GPDs)  IMPRESSION: This study is suggestive of moderate diffuse encephalopathy, nonspecific etiology but could be secondary to toxic-metabolic causes. Of note, generalized periodic discharges with triphasic morphology can be on the ictal-interictal continuum. However, the frequency and morphology of the discharges as well as reactivity to stimulation in this patient is more likely indicative of toxic-metabolic etiology. No seizures were seen throughout the recording.  Priyanka 10/17/2020  ECHOCARDIOGRAM COMPLETE  Result Date: 08/17/2020    ECHOCARDIOGRAM REPORT   Patient Name:   Jodi Barr Date of Exam: 08/17/2020 Medical Rec #:  [id number]        Height:       63.0 in Accession #:    [id number]       Weight:       116.8 lb Date of Birth:  1933/02/09        BSA:          1.539 m Patient Age:    88 years         BP:           191/96 mmHg Patient Gender: F                HR:           108 bpm. Exam Location:  Inpatient Procedure: 2D Echo, Cardiac Doppler and Color Doppler Indications:    Stroke  History:        Patient has no prior history of Echocardiogram examinations.                 Risk Factors:Hypertension.  Sonographer:    Shirlean Kelly Referring Phys: [id number] Leda Gauze  Sonographer Comments: Image acquisition challenging due to uncooperative patient. IMPRESSIONS  1. Left ventricular ejection fraction, by estimation, is 60 to 65%. The left ventricle has normal function. The left ventricle has no regional wall motion abnormalities. There is mild left ventricular hypertrophy of the basal-septal segment. Left ventricular diastolic function could not be evaluated.  2. Right ventricular systolic function is normal. The right ventricular size is normal.  3. The mitral valve is normal in structure. Mild mitral valve regurgitation. No evidence of mitral stenosis.  4. The aortic valve is tricuspid. There is moderate calcification of the aortic valve. There is moderate thickening of the aortic valve. Aortic valve regurgitation is not visualized. Mild to moderate aortic valve sclerosis/calcification is present, without any evidence of aortic stenosis.  5. The inferior vena cava is normal in size with greater than 50% respiratory variability, suggesting right atrial pressure of 3 mmHg. FINDINGS  Left Ventricle: Left ventricular ejection fraction, by estimation, is 60 to 65%. The left ventricle has normal function. The left ventricle has no regional wall motion abnormalities. The left ventricular internal cavity size was normal in size. There is  mild left ventricular hypertrophy of the basal-septal segment. Left ventricular diastolic function could not be evaluated due to atrial fibrillation. Left ventricular diastolic function could not be evaluated. Right Ventricle: The right ventricular size is normal. No increase in right ventricular wall thickness. Right ventricular systolic function is normal. Left Atrium: Left atrial size was normal in size. Right Atrium: Right atrial size was normal in size. Pericardium: There is no evidence of pericardial effusion. Mitral Valve: The mitral valve is normal in structure. Mild mitral annular calcification.  Mild mitral valve regurgitation. No evidence of mitral valve stenosis. Tricuspid Valve: The tricuspid valve is normal in structure. Tricuspid valve regurgitation is mild . No evidence of tricuspid stenosis. Aortic Valve: The aortic valve is tricuspid. There is moderate calcification of the aortic valve. There is moderate thickening of the aortic valve. Aortic valve regurgitation is not visualized. Mild to moderate aortic valve sclerosis/calcification is present, without any evidence of aortic stenosis. Aortic valve mean gradient measures 5.5 mmHg. Aortic valve peak gradient measures 9.5 mmHg. Aortic valve area, by VTI measures 2.16 cm. Pulmonic Valve: The pulmonic  valve was normal in structure. Pulmonic valve regurgitation is not visualized. No evidence of pulmonic stenosis. Aorta: The aortic root is normal in size and structure. Venous: The inferior vena cava is normal in size with greater than 50% respiratory variability, suggesting right atrial pressure of 3 mmHg. IAS/Shunts: No atrial level shunt detected by color flow Doppler.  LEFT VENTRICLE PLAX 2D LVIDd:         3.10 cm LVIDs:         2.30 cm LV PW:         0.80 cm LV IVS:        1.20 cm LVOT diam:     2.00 cm LV SV:         60 LV SV Index:   39 LVOT Area:     3.14 cm  RIGHT VENTRICLE             IVC RV S prime:     14.10 cm/s  IVC diam: 1.20 cm LEFT ATRIUM             Index       RIGHT ATRIUM          Index LA diam:        2.70 cm 1.75 cm/m  RA Area:     9.25 cm LA Vol (A2C):   37.4 ml 24.31 ml/m RA Volume:   16.70 ml 10.85 ml/m LA Vol (A4C):   41.0 ml 26.64 ml/m LA Biplane Vol: 39.2 ml 25.48 ml/m  AORTIC VALVE AV Area (Vmax):    2.40 cm AV Area (Vmean):   2.30 cm AV Area (VTI):     2.16 cm AV Vmax:           154.50 cm/s AV Vmean:          110.500 cm/s AV VTI:            0.279 m AV Peak Grad:      9.5 mmHg AV Mean Grad:      5.5 mmHg LVOT Vmax:         117.93 cm/s LVOT Vmean:        80.967 cm/s LVOT VTI:          0.192 m LVOT/AV VTI ratio: 0.69   AORTA Ao Root diam: 2.60 cm Ao Asc diam:  2.60 cm TRICUSPID VALVE TR Peak grad:   30.0 mmHg TR Vmax:        274.00 cm/s  SHUNTS Systemic VTI:  0.19 m Systemic Diam: 2.00 cm Armanda Magicraci Turner MD Electronically signed by Armanda Magicraci Turner MD Signature Date/Time: 08/17/2020/3:53:30 PM    Final    US THYROID  Result Date: 08/17/2020 CLINICAL DATA:  Thyromegaly EXAM: THYROID ULTRASOUND TECHNIQUE: Ultrasound examination of the thyroid gland and adjacent soft tissues was performed. COMPARISON:  CT angiography head and neck 08/17/2020 FINDINGS: Parenchymal Echotexture: Moderately heterogeneous Isthmus: 0.6 cm Right lobe: 5.2 x 2.8 x 3.4 cm Left lobe: 5.8 x 2.6 x 3.5 cm _________________________________________________________ Estimated total number of nodules >/= 1 cm: 2 Number of spongiform nodules >/=  2 cm not described below (TR1): 0 Number of mixed cystic and solid nodules >/= 1.5 cm not described below (TR2): 0 _________________________________________________________ The right thyroid lobe is diffusely enlarged and goiterous. No discrete discernible nodule is identified. 1.0 x 0.8 x 0.7 cm spongiform nodule in the left thyroid lobe does not meet criteria for FNA or surveillance. IMPRESSION: Diffusely enlarged heterogeneous thyroid without discrete nodule which meets criteria for FNA or  surveillance. The above is in keeping with the ACR TI-RADS recommendations - J Am Coll Radiol 2017;14:587-595. Electronically Signed   By: Acquanetta Belling M.D.   On: 08/17/2020 16:56   CT HEAD CODE STROKE WO CONTRAST  Result Date: 08/17/2020 CLINICAL DATA:  Code stroke. Sudden altered mental status and left-sided weakness. EXAM: CT HEAD WITHOUT CONTRAST TECHNIQUE: Contiguous axial images were obtained from the base of the skull through the vertex without intravenous contrast. COMPARISON:  None. FINDINGS: Brain: Age related atrophy. No focal abnormality seen affecting the brainstem or cerebellum. Cerebral hemispheres show generalized atrophy  with chronic small-vessel ischemic changes of the white matter. No evidence of cortical or large vessel territory infarction. Vascular: There is atherosclerotic calcification of the major vessels at the base of the brain. No definite acute hyperdense vessel. Skull: Normal Sinuses/Orbits: Clear/normal Other: None ASPECTS (Alberta Stroke Program Early CT Score) - Ganglionic level infarction (caudate, lentiform nuclei, internal capsule, insula, M1-M3 cortex): 7 - Supraganglionic infarction (M4-M6 cortex): 3 Total score (0-10 with 10 being normal): 10 IMPRESSION: 1. No acute finding. Atrophy and chronic small-vessel ischemic changes. Atherosclerotic calcification of the major vessels at the base of the brain. 2. ASPECTS is 10 3. These results were communicated to Dr. Selina Cooley at 9:07 amon 6/10/2022by text page via the [facility] messaging system. Electronically Signed   By: Paulina Fusi M.D.   On: 08/17/2020 09:08   CT ANGIO HEAD NECK W WO CM W PERF (CODE STROKE)  Result Date: 08/17/2020 CLINICAL DATA:  Code stroke presentation. Altered mental status and left-sided weakness by previous history. EXAM: CT ANGIOGRAPHY HEAD AND NECK CT PERFUSION BRAIN TECHNIQUE: Multidetector CT imaging of the head and neck was performed using the standard protocol during bolus administration of intravenous contrast. Multiplanar CT image reconstructions and MIPs were obtained to evaluate the vascular anatomy. Carotid stenosis measurements (when applicable) are obtained utilizing NASCET criteria, using the distal internal carotid diameter as the denominator. Multiphase CT imaging of the brain was performed following IV bolus contrast injection. Subsequent parametric perfusion maps were calculated using RAPID software. CONTRAST:  OMNIPAQUE IOHEXOL 350 MG/ML SOLN COMPARISON:  Head CT earlier same day. FINDINGS: CTA NECK FINDINGS Aortic arch: Aortic atherosclerosis. Brachiocephalic vessel origins not included in the region studied. Right  carotid system: Proximal innominate artery not included. Right common carotid artery widely patent to the bifurcation. No soft or calcified plaque at the carotid bifurcation or ICA bulb. Cervical ICA is tortuous but widely patent. Left carotid system: Left vertebral artery origin from the arch not included. Beginning at visualization, the left common carotid artery is widely patent to the bifurcation. No soft or calcified plaque at the carotid bifurcation. Cervical ICA is tortuous but widely patent. Vertebral arteries: Both vertebral artery origins are widely patent. Both vertebral arteries appear widely patent and normal through the cervical region to the foramen magnum. The right vertebral artery is dominant Skeleton: Exaggerated cervical lordosis. No significant degenerative changes. Other neck: No lymphadenopathy. Enlarged heterogeneous thyroid gland most consistent with goiter. Upper chest: Bilateral pleural effusions layering dependently with dependent atelectasis. Review of the MIP images confirms the above findings CTA HEAD FINDINGS Anterior circulation: Both internal carotid arteries are widely patent through the skull base and siphon regions. Minimal siphon atherosclerotic calcification but no stenosis. The anterior and middle cerebral vessels are patent. Diminutive right A1 segment, likely congenital. No large or medium vessel occlusion or correctable proximal stenosis. Fetal origin of the right PCA. Posterior circulation: Both vertebral arteries widely patent to  the basilar. No basilar stenosis. Posterior circulation branch vessels appear normal. As noted above, fetal origin of the right PCA. Venous sinuses: Patent and normal. Anatomic variants: None other significant. Review of the MIP images confirms the above findings CT Brain Perfusion Findings: ASPECTS: 10 CBF (<30%) Volume: 29mL Perfusion (Tmax>6.0s) volume: 58mL Mismatch Volume: 32mL Infarction Location:None IMPRESSION: Remarkable absence of  atherosclerotic plaque. No stenosis or irregularity. No large or medium vessel occlusion. Bilateral pleural effusions layering dependently. Brachiocephalic vessel origins from the arch not included, but given the absence of atherosclerotic disease elsewhere, disease would not be expected. Enlarged heterogeneous thyroid. Recommend thyroid ultrasound (ref: J Am Coll Radiol. 2015 Feb;12(2): 143-50). Electronically Signed   By: Paulina Fusi M.D.   On: 08/17/2020 09:32   (Echo, Carotid, EGD, Colonoscopy, ERCP)    Subjective: No complaints feels great  Discharge Exam: Vitals:   08/29/20 0440 08/29/20 0728  BP: 140/71 (!) 174/74  Pulse: 69 63  Resp: 18 18  Temp: 97.9 F (36.6 C) 98.2 F (36.8 C)  SpO2: 93% 100%   Vitals:   08/28/20 2006 08/29/20 0003 08/29/20 0440 08/29/20 0728  BP: 123/61 121/62 140/71 (!) 174/74  Pulse: (!) 59 (!) 59 69 63  Resp: 16 17 18 18   Temp: 97.7 F (36.5 C) 97.9 F (36.6 C) 97.9 F (36.6 C) 98.2 F (36.8 C)  TempSrc:  Oral Oral Oral  SpO2: 91% 98% 93% 100%  Weight:        General: Pt is alert, awake, not in acute distress Cardiovascular: RRR, S1/S2 +, no rubs, no gallops Respiratory: CTA bilaterally, no wheezing, no rhonchi Abdominal: Soft, NT, ND, bowel sounds + Extremities: no edema, no cyanosis    The results of significant diagnostics from this hospitalization (including imaging, microbiology, ancillary and laboratory) are listed below for reference.     Microbiology: Recent Results (from the past 240 hour(s))  SARS CORONAVIRUS 2 (TAT 6-24 HRS) Nasopharyngeal Nasopharyngeal Swab     Status: None   Collection Time: 08/23/20 12:58 PM   Specimen: Nasopharyngeal Swab  Result Value Ref Range Status   SARS Coronavirus 2 NEGATIVE NEGATIVE Final    Comment: (NOTE) SARS-CoV-2 target nucleic acids are NOT DETECTED.  The SARS-CoV-2 RNA is generally detectable in upper and lower respiratory specimens during the acute phase of infection.  Negative results do not preclude SARS-CoV-2 infection, do not rule out co-infections with other pathogens, and should not be used as the sole basis for treatment or other patient management decisions. Negative results must be combined with clinical observations, patient history, and epidemiological information. The expected result is Negative.  Fact Sheet for Patients: 08/25/20  Fact Sheet for Healthcare Providers: HairSlick.no  This test is not yet approved or cleared by the [place] FDA and  has been authorized for detection and/or diagnosis of SARS-CoV-2 by FDA under an Emergency Use Authorization (EUA). This EUA will remain  in effect (meaning this test can be used) for the duration of the COVID-19 declaration under Se ction 564(b)(1) of the Act, 21 U.S.C. section 360bbb-3(b)(1), unless the authorization is terminated or revoked sooner.  Performed at [facility], [address], [place], [place]      Labs: BNP (last 3 results) Recent Labs    08/17/20 1450  BNP 653.8*   Basic Metabolic Panel: Recent Labs  Lab 08/24/20 0503  CREATININE 0.79   Liver Function Tests: No results for input(s): AST, ALT, ALKPHOS, BILITOT, PROT, ALBUMIN in the last 168 hours.  No results for input(s): LIPASE, AMYLASE in the last 168 hours. No results for input(s): AMMONIA in the last 168 hours. CBC: No results for input(s): WBC, NEUTROABS, HGB, HCT, MCV, PLT in the last 168 hours. Cardiac Enzymes: No results for input(s): CKTOTAL, CKMB, CKMBINDEX, TROPONINI in the last 168 hours. BNP: Invalid input(s): POCBNP CBG: No results for input(s): GLUCAP in the last 168 hours. D-Dimer No results for input(s): DDIMER in the last 72 hours. Hgb A1c No results for input(s): HGBA1C in the last 72 hours. Lipid Profile No results for input(s): CHOL, HDL, LDLCALC, TRIG, CHOLHDL, LDLDIRECT in the last 72  hours. Thyroid function studies No results for input(s): TSH, T4TOTAL, T3FREE, THYROIDAB in the last 72 hours.  Invalid input(s): FREET3 Anemia work up No results for input(s): VITAMINB12, FOLATE, FERRITIN, TIBC, IRON, RETICCTPCT in the last 72 hours. Urinalysis    Component Value Date/Time   COLORURINE STRAW (A) 08/17/2020 1645   APPEARANCEUR CLEAR 08/17/2020 1645   LABSPEC 1.013 08/17/2020 1645   PHURINE 7.0 08/17/2020 1645   GLUCOSEU 150 (A) 08/17/2020 1645   HGBUR MODERATE (A) 08/17/2020 1645   BILIRUBINUR NEGATIVE 08/17/2020 1645   BILIRUBINUR negative 07/07/2015 0943   BILIRUBINUR neg 10/20/2014 1848   KETONESUR 5 (A) 08/17/2020 1645   PROTEINUR 100 (A) 08/17/2020 1645   UROBILINOGEN 0.2 07/07/2015 0943   NITRITE NEGATIVE 08/17/2020 1645   LEUKOCYTESUR NEGATIVE 08/17/2020 1645   Sepsis Labs Invalid input(s): PROCALCITONIN,  WBC,  LACTICIDVEN Microbiology Recent Results (from the past 240 hour(s))  SARS CORONAVIRUS 2 (TAT 6-24 HRS) Nasopharyngeal Nasopharyngeal Swab     Status: None   Collection Time: 08/23/20 12:58 PM   Specimen: Nasopharyngeal Swab  Result Value Ref Range Status   SARS Coronavirus 2 NEGATIVE NEGATIVE Final    Comment: (NOTE) SARS-CoV-2 target nucleic acids are NOT DETECTED.  The SARS-CoV-2 RNA is generally detectable in upper and lower respiratory specimens during the acute phase of infection. Negative results do not preclude SARS-CoV-2 infection, do not rule out co-infections with other pathogens, and should not be used as the sole basis for treatment or other patient management decisions. Negative results must be combined with clinical observations, patient history, and epidemiological information. The expected result is Negative.  Fact Sheet for Patients: HairSlick.no  Fact Sheet for Healthcare Providers: quierodirigir.com  This test is not yet approved or cleared by the [place]  FDA and  has been authorized for detection and/or diagnosis of SARS-CoV-2 by FDA under an Emergency Use Authorization (EUA). This EUA will remain  in effect (meaning this test can be used) for the duration of the COVID-19 declaration under Se ction 564(b)(1) of the Act, 21 U.S.C. section 360bbb-3(b)(1), unless the authorization is terminated or revoked sooner.  Performed at [facility], [address], [place], [place] [address]      Time coordinating discharge: Over 30 minutes  SIGNED:   Marinda Elk, MD  [facility] 08/29/2020, 8:41 AM Pager   If 7PM-7AM, please contact night-coverage [email or web address] Password [email or web address]

## 2020-08-29 NOTE — TOC Transition Note (Signed)
Transition of Care [facility]) - CM/SW Discharge Note   Patient Details  Name: Jodi Barr MRN: [id number] Date of Birth: 1933-01-16  Transition [facility]) CM/SW Contact:  Lorri Frederick, LCSW Phone Number: 08/29/2020, 3:48 PM   Clinical Narrative:   Pt discharging to [facility], room 3238.  RN call report to [phone].  Please make sure covid test is back and is negative prior to sending patient with [facility].    Final next level of care: Skilled Nursing Facility Barriers to Discharge: Barriers Resolved   Patient Goals and CMS Choice Patient states their goals for this hospitalization and ongoing recovery are:: to get back home CMS Medicare.gov Compare Post Acute Care list provided to:: Patient Represenative (must comment) Choice offered to / list presented to : [facility] POA / Guardian  Discharge Placement              Patient chooses bed at: [facility] Patient to be transferred to facility by: PTAR Name of family member notified: [name], [place] Patient and family notified of of transfer: 08/29/20  Discharge Plan and Services     Post Acute Care Choice: Skilled Nursing Facility                               Social Determinants of Health (SDOH) Interventions     Readmission Risk Interventions No flowsheet data found.

## 2020-08-29 NOTE — Progress Notes (Deleted)
Nutrition Brief Note  Chart reviewed. Pt now transitioning to comfort care.  No further nutrition interventions planned at this time.  Please re-consult as needed.   Teya Otterson, MS, RD, LDN (she/her/hers) RD pager number and weekend/on-call pager number located in Amion.   

## 2020-08-29 NOTE — Plan of Care (Signed)
  Problem: Education: Goal: Knowledge of General Education information will improve Description: Including pain rating scale, medication(s)/side effects and non-pharmacologic comfort measures Outcome: Progressing   Problem: Health Behavior/Discharge Planning: Goal: Ability to manage health-related needs will improve Outcome: Progressing   Problem: Clinical Measurements: Goal: Ability to maintain clinical measurements within normal limits will improve Outcome: Progressing Goal: Will remain free from infection Outcome: Progressing Goal: Diagnostic test results will improve Outcome: Progressing Goal: Respiratory complications will improve Outcome: Progressing Goal: Cardiovascular complication will be avoided Outcome: Progressing   Problem: Activity: Goal: Risk for activity intolerance will decrease Outcome: Progressing   Problem: Nutrition: Goal: Adequate nutrition will be maintained Outcome: Progressing   Problem: Coping: Goal: Level of anxiety will decrease Outcome: Progressing   Problem: Elimination: Goal: Will not experience complications related to bowel motility Outcome: Progressing Goal: Will not experience complications related to urinary retention Outcome: Progressing   Problem: Pain Managment: Goal: General experience of comfort will improve Outcome: Progressing   Problem: Safety: Goal: Ability to remain free from injury will improve Outcome: Progressing   Problem: Skin Integrity: Goal: Risk for impaired skin integrity will decrease Outcome: Progressing   Problem: Education: Goal: Expressions of having a comfortable level of knowledge regarding the disease process will increase Outcome: Progressing   Problem: Coping: Goal: Ability to adjust to condition or change in health will improve Outcome: Progressing Goal: Ability to identify appropriate support needs will improve Outcome: Progressing   Problem: Health Behavior/Discharge Planning: Goal:  Compliance with prescribed medication regimen will improve Outcome: Progressing   Problem: Medication: Goal: Risk for medication side effects will decrease Outcome: Progressing   Problem: Clinical Measurements: Goal: Complications related to the disease process, condition or treatment will be avoided or minimized Outcome: Progressing Goal: Diagnostic test results will improve Outcome: Progressing   Problem: Safety: Goal: Verbalization of understanding the information provided will improve Outcome: Progressing   Problem: Self-Concept: Goal: Level of anxiety will decrease Outcome: Progressing Goal: Ability to verbalize feelings about condition will improve Outcome: Progressing   Problem: Education: Goal: Knowledge of disease or condition will improve Outcome: Progressing Goal: Knowledge of secondary prevention will improve Outcome: Progressing Goal: Knowledge of patient specific risk factors addressed and post discharge goals established will improve Outcome: Progressing Goal: Individualized Educational Video(s) Outcome: Progressing   Problem: Coping: Goal: Will verbalize positive feelings about self Outcome: Progressing Goal: Will identify appropriate support needs Outcome: Progressing   Problem: Health Behavior/Discharge Planning: Goal: Ability to manage health-related needs will improve Outcome: Progressing   Problem: Self-Care: Goal: Ability to participate in self-care as condition permits will improve Outcome: Progressing Goal: Verbalization of feelings and concerns over difficulty with self-care will improve Outcome: Progressing Goal: Ability to communicate needs accurately will improve Outcome: Progressing   Problem: Nutrition: Goal: Risk of aspiration will decrease Outcome: Progressing Goal: Dietary intake will improve Outcome: Progressing   Problem: Ischemic Stroke/TIA Tissue Perfusion: Goal: Complications of ischemic stroke/TIA will be  minimized Outcome: Progressing   

## 2020-08-29 NOTE — Progress Notes (Signed)
Pt dc with [facility].

## 2020-08-29 NOTE — Progress Notes (Deleted)
HOSPITAL MEDICINE OVERNIGHT EVENT NOTE    Notified by nursing the patient has an indwelling Foley catheter without an order.  Nursing, an order was placed for the Foley cath to be removed on 6/16 however patient developed recurrent urinary retention requiring 3 separate incidents of  intermittent catheterizations within 24 hours of removal.  Nursing states that the Foley catheter was reinserted that time the patient has had a Foley catheter ever since but still does not have an active Foley catheter ordered.  We will go ahead and place an order for the Foley catheter now, day provider can review whether or not this continues to be indicated and discontinue if necessary.  Marinda Elk  MD [facility]

## 2020-08-29 NOTE — TOC Progression Note (Addendum)
Transition of Care [facility]) - Progression Note    Patient Details  Name: Jodi Barr MRN: [id number] Date of Birth: March 22, 1932  Transition of Care [facility]) CM/SW Contact  Lorri Frederick, LCSW Phone Number: 08/29/2020, 10:25 AM  Clinical Narrative:  CSW spoke with Leana Gamer who reports he has chosen [facility].  CSW LM with Janie at [facility] to confirm bed availability today.  CSW requested Johnell Comings to initiate [employer].    1135: Janie/[facility] will have bed tomorrow.   1200: Janie/[facility] will have bed today after all.   1430: Auth received from [employer]: 3 days approved, 6/22-6/24 with review on 6/24.  FAO#[id number].  Per Wille Celeste, will go to room 3238.  Can come after negative covid.       Expected Discharge Plan: Skilled Nursing Facility Barriers to Discharge: Continued Medical Work up, English as a second language teacher  Expected Discharge Plan and Services Expected Discharge Plan: Skilled Nursing Facility     Post Acute Care Choice: Skilled Nursing Facility Living arrangements for the past 2 months: Single Family Home Expected Discharge Date: 08/29/20                                     Social Determinants of Health (SDOH) Interventions    Readmission Risk Interventions No flowsheet data found.

## 2020-08-29 NOTE — Progress Notes (Signed)
This chaplain is present for F/U spiritual care.  The Pt. is resting comfortably and awakes easily to the call of her name.    Through reflective listening the chaplain learns, the Pt. understands the hospital is preparing for Pt. discharge. The chaplain often redirects the Pt. language of going "home" to going to a SNF. The Pt. accepts the role of a SNF as a place to continue her care.  The chaplain affirms the Pt. continued stories of her loving neighbors and her faith.  Today's faith stories have stepped away from the theme of death. Today the chaplain learns the Pt. keeps her favorite chocolate in the refrigerator.   The chaplain chooses a passage out of the Pt. devotional to share and ties it into reading of Psalm 23. The Pt. accepts prayer with the chaplain and F/U spiritual care.

## 2020-08-30 DIAGNOSIS — F32A Depression, unspecified: Secondary | ICD-10-CM | POA: Diagnosis not present

## 2020-08-30 DIAGNOSIS — K5900 Constipation, unspecified: Secondary | ICD-10-CM | POA: Diagnosis not present

## 2020-08-30 DIAGNOSIS — E871 Hypo-osmolality and hyponatremia: Secondary | ICD-10-CM | POA: Diagnosis not present

## 2020-08-30 DIAGNOSIS — I10 Essential (primary) hypertension: Secondary | ICD-10-CM | POA: Diagnosis not present

## 2020-08-30 DIAGNOSIS — M545 Low back pain, unspecified: Secondary | ICD-10-CM | POA: Diagnosis not present

## 2020-08-30 DIAGNOSIS — I4891 Unspecified atrial fibrillation: Secondary | ICD-10-CM | POA: Diagnosis not present

## 2020-08-30 DIAGNOSIS — E785 Hyperlipidemia, unspecified: Secondary | ICD-10-CM | POA: Diagnosis not present

## 2020-08-30 DIAGNOSIS — R339 Retention of urine, unspecified: Secondary | ICD-10-CM | POA: Diagnosis not present

## 2020-08-30 DIAGNOSIS — M1990 Unspecified osteoarthritis, unspecified site: Secondary | ICD-10-CM | POA: Diagnosis not present

## 2020-08-31 DIAGNOSIS — I4891 Unspecified atrial fibrillation: Secondary | ICD-10-CM | POA: Diagnosis not present

## 2020-08-31 DIAGNOSIS — I10 Essential (primary) hypertension: Secondary | ICD-10-CM | POA: Diagnosis not present

## 2020-08-31 DIAGNOSIS — J9601 Acute respiratory failure with hypoxia: Secondary | ICD-10-CM | POA: Diagnosis not present

## 2020-09-07 DEATH — deceased

## 2021-09-04 IMAGING — MR MR HEAD WO/W CM
10 of 14 series · 34 of 48 positions shown · IV contrast (Yes   MULTIHANCE)
Comparison: Head CT 08/18/2020

CLINICAL DATA: Seizure.  Significant hypertension on presentation.

EXAM:
MRI HEAD WITHOUT AND WITH CONTRAST
TECHNIQUE: Multiplanar, multiecho pulse sequences of the brain and surrounding
structures were obtained without and with intravenous contrast.
CONTRAST:  5.5mL GADAVIST GADOBUTROL 1 MMOL/ML IV SOLN

[Series 3: DWI · axial · 3.0mm · 1.09mm/px · z∈[-30,+117]mm · 8 of 102 slices shown (1 of 4)]
[im 1/102]
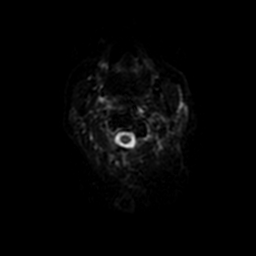
[im 15/102]
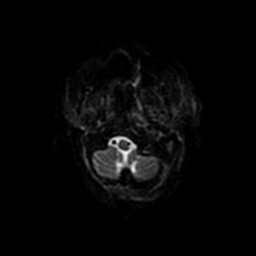
[im 29/102]
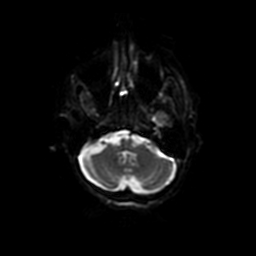
[im 44/102]
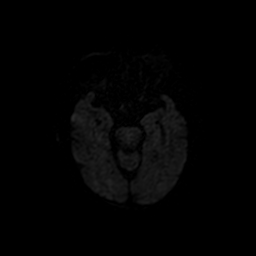
[im 58/102]
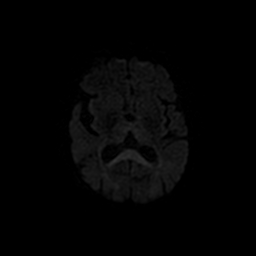
[im 73/102]
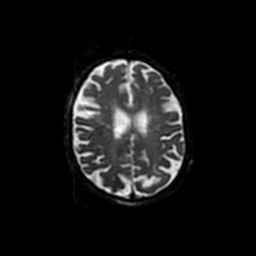
[im 87/102]
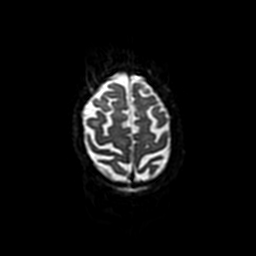
[im 102/102]
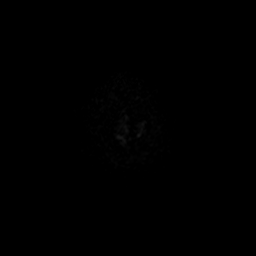

[Series 4: DWI · coronal · 5.0mm · 1.09mm/px · 5 of 66 slices shown (2 of 4)]
[im 1/66]
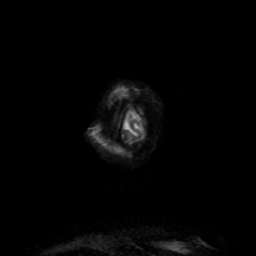
[im 17/66]
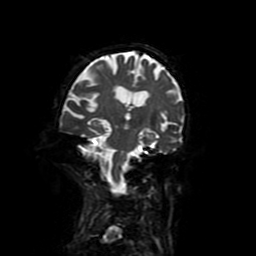
[im 33/66]
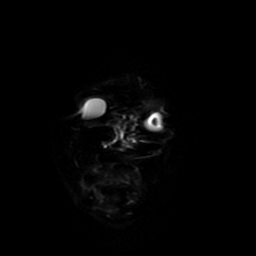
[im 49/66]
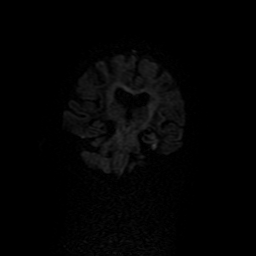
[im 66/66]
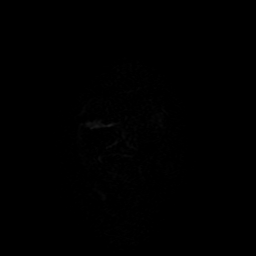

[Series 6: T2 · axial · 5.0mm · 0.43mm/px · z∈[-39,+108]mm · 2 of 26 slices shown (1 of 2)]
[im 1/26]
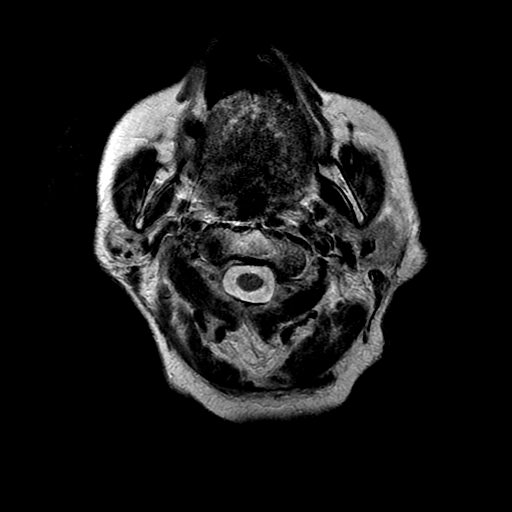
[im 26/26]
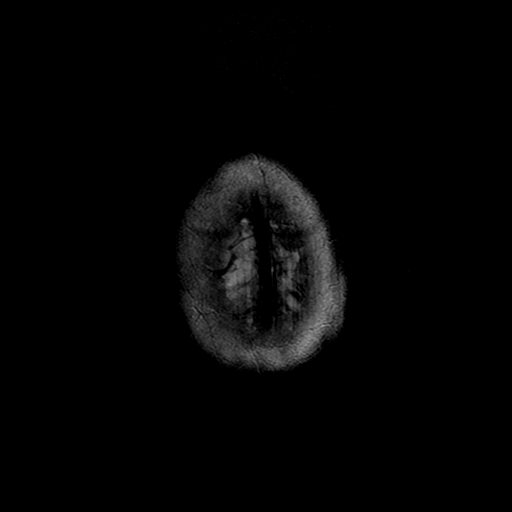

[Series 7: FLAIR · axial · 3.0mm · 0.43mm/px · z∈[-39,+108]mm · 2 of 26 slices shown (1 of 2)]
[im 1/26]
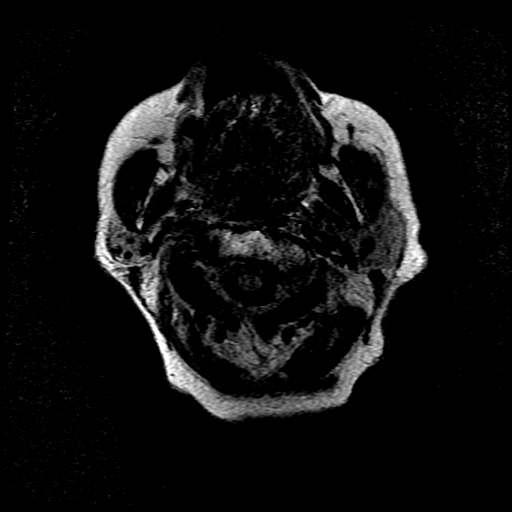
[im 26/26]
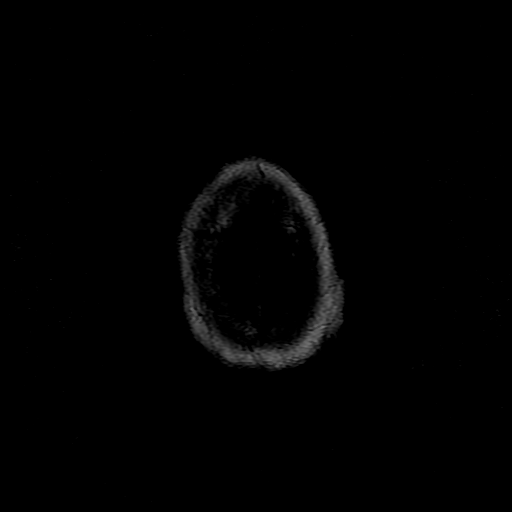

[Series 10: T2 · coronal · 3.0mm · 0.35mm/px · 2 of 26 slices shown (2 of 2)]
[im 1/26]
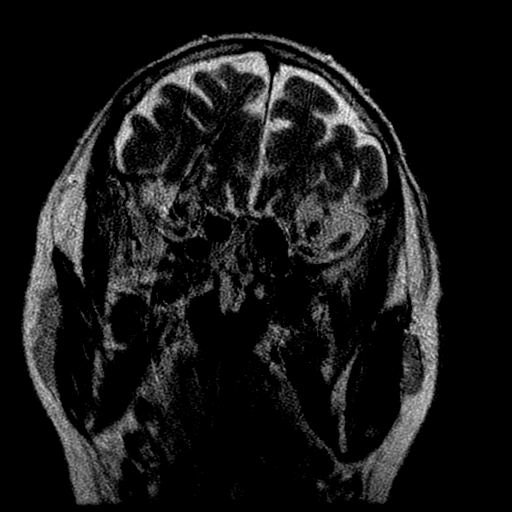
[im 26/26]
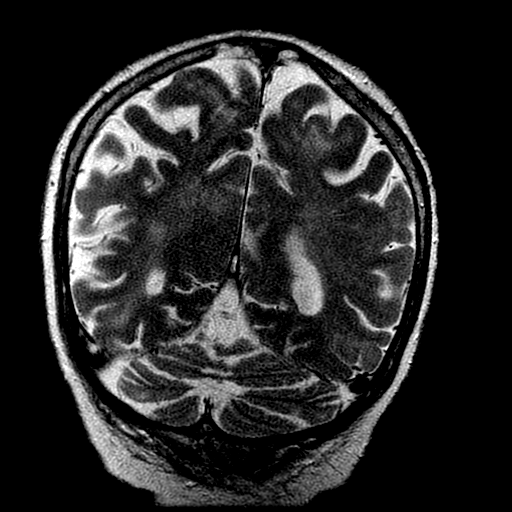

[Series 11: FLAIR · coronal · 3.0mm · 0.35mm/px · 2 of 26 slices shown (2 of 2)]
[im 1/26]
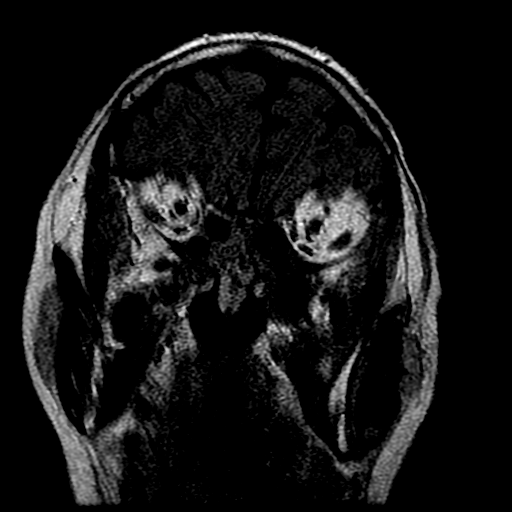
[im 26/26]
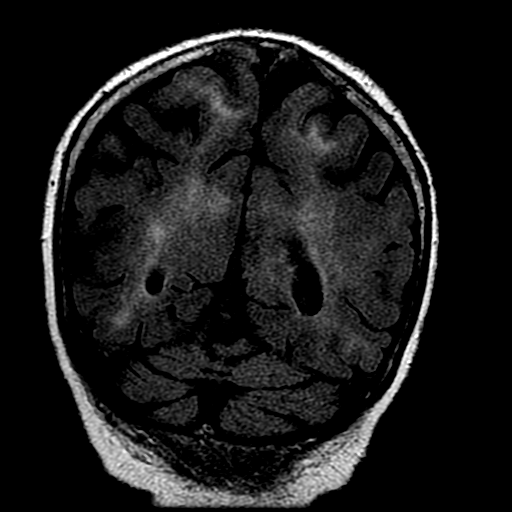

[Series 13: T1 post-contrast · axial · 3.0mm · 0.47mm/px · z∈[-37,+112]mm · 4 of 52 slices shown (1 of 2)]
[im 1/52]
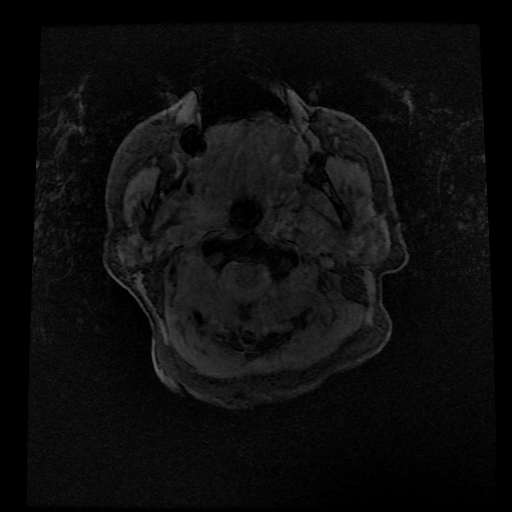
[im 18/52]
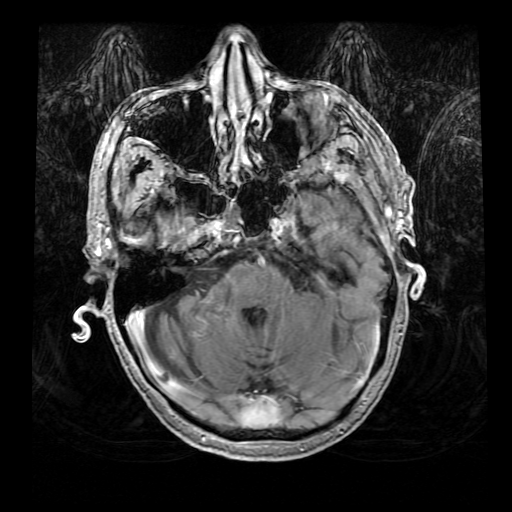
[im 35/52]
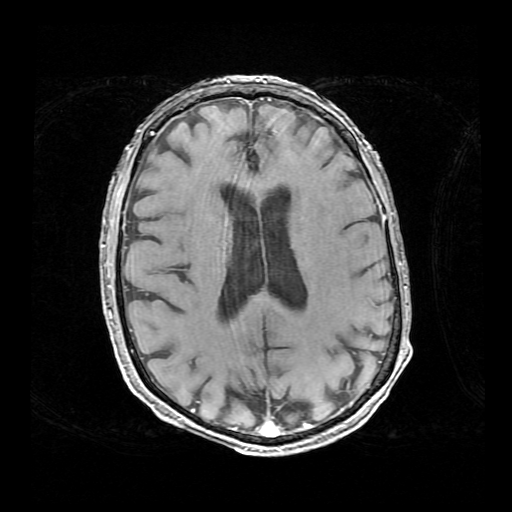
[im 52/52]
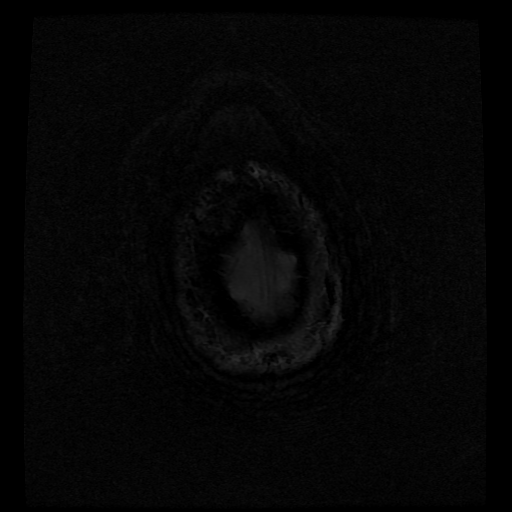

[Series 14: T1 post-contrast · coronal · 5.0mm · 0.78mm/px · 2 of 25 slices shown (2 of 2)]
[im 1/25]
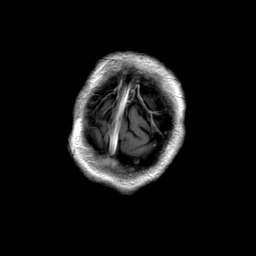
[im 25/25]
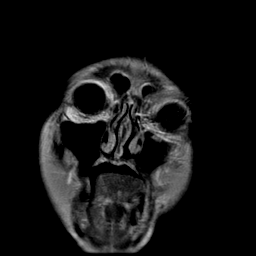

[Series 300: DWI · axial · 3.0mm · 1.09mm/px · z∈[-30,+117]mm · 4 of 51 slices shown (3 of 4)]
[im 1/51]
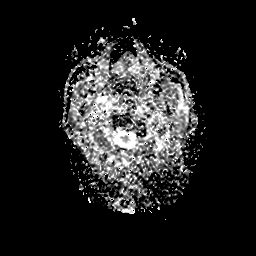
[im 17/51]
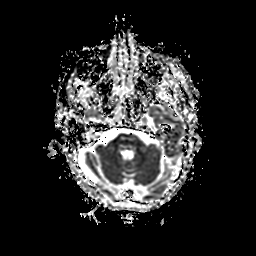
[im 34/51]
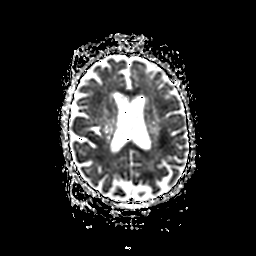
[im 51/51]
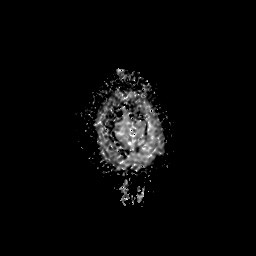

[Series 400: DWI · coronal · 5.0mm · 1.09mm/px · 3 of 33 slices shown (4 of 4)]
[im 1/33]
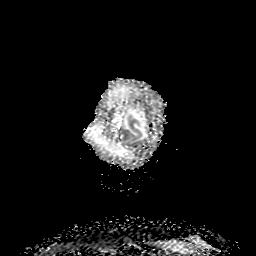
[im 17/33]
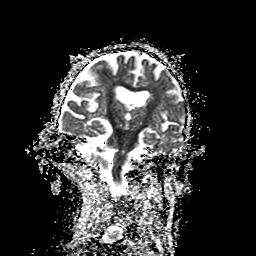
[im 33/33]
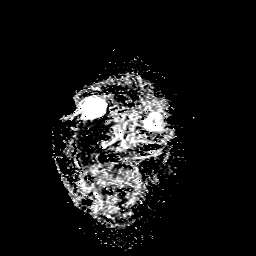

[34 of 48 positions shown; findings below may reference images not displayed]

FINDINGS: Brain: There is no evidence of an acute infarct, intracranial
hemorrhage, mass, midline shift, or extra-axial fluid collection.
There are patchy T2 hyperintensities in the cerebral white matter
bilaterally which are mild-to-moderate for age. Of note, there is
relatively symmetric T2 hyperintensity in the subcortical white
matter of the occipital, parietal, and posterior frontal lobes.
There is mild-to-moderate cerebral atrophy. No abnormal enhancement
is identified. Dedicated temporal lobe imaging is mildly motion
degraded with symmetric size in grossly symmetric signal of the
hippocampi.

Vascular: Major intracranial vascular flow voids are preserved.

Skull and upper cervical spine: Unremarkable bone marrow signal.

Sinuses/Orbits: Unremarkable orbits. Scattered mild mucosal
thickening in the paranasal sinuses. No significant mastoid fluid.

Other: None.
IMPRESSION: 1. T2 hyperintensities involving the subcortical white matter of
both posterior cerebral hemispheres suspicious for posterior
reversible encephalopathy syndrome (PRES).
2. Background mild-to-moderate chronic small vessel ischemia in the
cerebral white matter.
3. No acute infarct.
